# Patient Record
Sex: Female | Born: 1950 | Race: Black or African American | Hispanic: No | Marital: Single | Smoking: Current some day smoker
Health system: Southern US, Community
[De-identification: ages and names within clinical notes are randomized; demographics above are authoritative.]

## PROBLEM LIST (undated history)

## (undated) DIAGNOSIS — R188 Other ascites: Secondary | ICD-10-CM

## (undated) DIAGNOSIS — K7460 Unspecified cirrhosis of liver: Secondary | ICD-10-CM

## (undated) DIAGNOSIS — K766 Portal hypertension: Secondary | ICD-10-CM

## (undated) DIAGNOSIS — E785 Hyperlipidemia, unspecified: Secondary | ICD-10-CM

## (undated) DIAGNOSIS — E119 Type 2 diabetes mellitus without complications: Secondary | ICD-10-CM

## (undated) DIAGNOSIS — I8500 Esophageal varices without bleeding: Secondary | ICD-10-CM

## (undated) DIAGNOSIS — K7581 Nonalcoholic steatohepatitis (NASH): Secondary | ICD-10-CM

## (undated) DIAGNOSIS — I10 Essential (primary) hypertension: Secondary | ICD-10-CM

## (undated) DIAGNOSIS — K922 Gastrointestinal hemorrhage, unspecified: Secondary | ICD-10-CM

## (undated) DIAGNOSIS — I214 Non-ST elevation (NSTEMI) myocardial infarction: Secondary | ICD-10-CM

## (undated) DIAGNOSIS — I509 Heart failure, unspecified: Secondary | ICD-10-CM

## (undated) DIAGNOSIS — K219 Gastro-esophageal reflux disease without esophagitis: Secondary | ICD-10-CM

## (undated) HISTORY — DX: Unspecified cirrhosis of liver: K74.60

## (undated) HISTORY — DX: Hyperlipidemia, unspecified: E78.5

## (undated) HISTORY — DX: Gastro-esophageal reflux disease without esophagitis: K21.9

## (undated) HISTORY — DX: Non-ST elevation (NSTEMI) myocardial infarction: I21.4

## (undated) HISTORY — DX: Portal hypertension: K76.6

## (undated) HISTORY — PX: OTHER SURGICAL HISTORY: SHX169

## (undated) HISTORY — DX: Heart failure, unspecified: I50.9

---

## 2014-12-29 ENCOUNTER — Encounter: Payer: Self-pay | Admitting: Gastroenterology

## 2015-01-09 ENCOUNTER — Ambulatory Visit: Payer: Self-pay | Admitting: Gastroenterology

## 2015-01-23 ENCOUNTER — Ambulatory Visit: Payer: Self-pay | Admitting: Gastroenterology

## 2015-01-26 ENCOUNTER — Encounter: Payer: Self-pay | Admitting: Gastroenterology

## 2015-01-26 ENCOUNTER — Ambulatory Visit (INDEPENDENT_AMBULATORY_CARE_PROVIDER_SITE_OTHER): Payer: Self-pay | Admitting: Gastroenterology

## 2015-01-26 VITALS — BP 113/68 | HR 93 | Temp 97.1°F | Ht 63.0 in | Wt 196.2 lb

## 2015-01-26 DIAGNOSIS — R1030 Lower abdominal pain, unspecified: Secondary | ICD-10-CM | POA: Insufficient documentation

## 2015-01-26 DIAGNOSIS — K219 Gastro-esophageal reflux disease without esophagitis: Secondary | ICD-10-CM

## 2015-01-26 DIAGNOSIS — K7460 Unspecified cirrhosis of liver: Secondary | ICD-10-CM | POA: Insufficient documentation

## 2015-01-26 NOTE — Progress Notes (Signed)
CC'ED TO PCP 

## 2015-01-26 NOTE — Patient Instructions (Signed)
1. I will review your CT from [facility] and get in touch with you with further instructions.

## 2015-01-26 NOTE — Progress Notes (Signed)
Primary Care Physician:  [name], MD  Primary Gastroenterologist:  Barney Drain, MD   Chief Complaint  Patient presents with  . Cirrhosis  . abnormal CT    HPI:  Cheyenne Hicks is a 64 y.o. female here at the request of her PCP for further evaluation of ?cirrhosis seen on CT done during hospitalization at [facility] recently. Patient presented on October 16 with shortness of breath. She reports she had pneumonia. According to the medical record she also has CHF, STEMI. Patient reports undergoing a cardiac catheterization, possibly had a reaction, ended up in the ICU. We have requested records for review.  Prior to this hospitalization she was only taken aspirin and over-the-counter acid reflux medications. She has a history of GERD. She was switched to Protonix and is doing very well with regards to her acid reflux disease. Heartburn is well-controlled. Denies any history of dysphagia. Her breathing has improved. She feels a lot better now. She states she was given MiraLAX for constipation. She has been having some postprandial early satiety, lower abdominal discomfort with meals. She felt like it may have been the MiraLAX so she stopped about a week ago but symptoms persist. She describes lower abdominal pain is nagging pain. No associated nausea or vomiting. States her bowel function is normal now. Denies dysuria. No weight loss. Weight is up about 4 pounds.  Patient tells me she has a history of consuming 1 pint of liquor over 24-hour period of time about once per week. Denies daily consumption in past or present. During office visit she did ask if it be okay for her to consume alcohol over the holidays. I advised against it especially without having records to review. Is unclear the etiology of her CHF or if she has cirrhosis. I did discuss with her that one pint of liquor is way too much over 24-hour period time anyway.    Current Outpatient Prescriptions  Medication Sig  Dispense Refill  . aspirin 81 MG tablet Take 81 mg by mouth daily.    . carvedilol (COREG) 6.25 MG tablet Take 6.25 mg by mouth 2 (two) times daily with a meal.    . furosemide (LASIX) 40 MG tablet Take 40 mg by mouth.    Marland Kitchen lisinopril (PRINIVIL,ZESTRIL) 20 MG tablet Take 20 mg by mouth 2 (two) times daily.    . pantoprazole (PROTONIX) 40 MG tablet Take 40 mg by mouth daily.    . potassium chloride (KLOR-CON) 20 MEQ packet Take by mouth once.    . simvastatin (ZOCOR) 40 MG tablet Take 40 mg by mouth daily at 6 PM.     No current facility-administered medications for this visit.    Allergies as of 01/26/2015  . (No Known Allergies)    Past Medical History  Diagnosis Date  . CHF (congestive heart failure) ([place])   . Hyperlipidemia   . Cirrhosis ([place])   . Portal hypertension ([place])   . NSTEMI (non-ST elevated myocardial infarction) ([place])     11/2014  . GERD (gastroesophageal reflux disease)     Past Surgical History  Procedure Laterality Date  . None      Family History  Problem Relation Age of Onset  . Colon cancer Neg Hx   . Heart attack Mother   . Liver disease Other     patient not aware of any  . Heart attack Sister     died age 60    Social History   Social History  .  Marital Status: Unknown    Spouse Name: N/A  . Number of Children: 1  . Years of Education: N/A   Occupational History  . 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 Smoking status: Current Some Day Smoker -- 0.25 packs/day    Types: Cigarettes  . Smokeless tobacco: Not on file  . Alcohol Use: Not on file     Comment: pint in 24 hours once per week  . Drug Use: No  . Sexual Activity: Not on file   Other Topics Concern  . Not on file   Social History Narrative  . No narrative on file      ROS:  General: Negative for anorexia, weight loss, fever, chills, fatigue, weakness. Eyes: Negative for vision changes.  ENT: Negative for hoarseness, difficulty swallowing , nasal  congestion. CV: Negative for chest pain, angina, palpitations, dyspnea on exertion, peripheral edema. See history of present illness Respiratory: Negative for dyspnea at rest, dyspnea on exertion, cough, sputum, wheezing.  GI: See history of present illness. GU:  Negative for dysuria, hematuria, urinary incontinence, urinary frequency, nocturnal urination.  MS: Negative for joint pain, low back pain.  Derm: Negative for rash or itching.  Neuro: Negative for weakness, abnormal sensation, seizure, frequent headaches, memory loss, confusion.  Psych: Negative for anxiety, depression, suicidal ideation, hallucinations.  Endo: Negative for unusual weight change.  Heme: Negative for bruising or bleeding. Allergy: Negative for rash or hives.    Physical Examination:  BP 113/68 mmHg  Pulse 93  Temp(Src) 97.1 F (36.2 C)  Ht 5' 3"  (1.6 m)  Wt 196 lb 3.2 oz (88.996 kg)  BMI 34.76 kg/m2   General: Well-nourished, well-developed in no acute distress.  Head: Normocephalic, atraumatic.   Eyes: Conjunctiva pink, no icterus. Mouth: Oropharyngeal mucosa moist and pink , no lesions erythema or exudate. Neck: Supple without thyromegaly, masses, or lymphadenopathy.  Lungs: Clear to auscultation bilaterally.  Heart: Regular rate and rhythm, no murmurs rubs or gallops.  Abdomen: Bowel sounds are normal, nontender, nondistended, no hepatosplenomegaly or masses, no abdominal bruits or    hernia , no rebound or guarding.   Rectal: Not performed Extremities: No lower extremity edema. No clubbing or deformities.  Neuro: Alert and oriented x 4 , grossly normal neurologically.  Skin: Warm and dry, no rash or jaundice.   Psych: Alert and cooperative, normal mood and affect.  Labs: Reviewed outside labs. Dated 12/12/2014 White blood cell count 6.37, hemoglobin 12.8, hematocrit 40.1, MCV 87, platelets 105,000 low, potassium 3.4, calcium 9, total bilirubin 1.1, albumin 3.1 low, AST 20, ALT 15, alkaline  phosphatase 89, hemoglobin A1c 7.7, hepatitis A IgM nonreactive, hepatitis B core IgM antibody nonreactive, hepatitis B surface antigen nonreactive, hepatitis C antibody nonreactive, INR 1.0 low, PT 11.1  Imaging Studies: No results found.

## 2015-01-26 NOTE — Assessment & Plan Note (Signed)
64 year old female who presents for further evaluation based on abnormality seen on CT scan during recent hospitalization. According to the patient she was told that she had cirrhosis. We have requested copy of CT scan and other records. Viral markers negative. Hepatitis A and B immune status unknown. She has a history of significant alcohol consumption when she does drink (reports once per week) although she appears to be in denial. We will retrieve records. If she does have any evidence of advanced liver disease, we will check for hepatitis A and B immune status, she will likely need an upper endoscopy to screen for esophageal varices, consider other serologies to rule out other underlying liver disease. She also needs to have screening colonoscopy in the near future and can be done at time of her upper endoscopy.  Await CT report prior to making recommendations regarding lower abdominal pain. Unremarkable exam today. Etiology unclear.

## 2015-02-26 ENCOUNTER — Ambulatory Visit (INDEPENDENT_AMBULATORY_CARE_PROVIDER_SITE_OTHER): Payer: Self-pay | Admitting: Gastroenterology

## 2015-02-26 ENCOUNTER — Encounter: Payer: Self-pay | Admitting: Gastroenterology

## 2015-02-26 VITALS — BP 115/70 | HR 99 | Temp 97.3°F | Ht 63.0 in | Wt 196.4 lb

## 2015-02-26 DIAGNOSIS — K7460 Unspecified cirrhosis of liver: Secondary | ICD-10-CM

## 2015-02-26 DIAGNOSIS — K219 Gastro-esophageal reflux disease without esophagitis: Secondary | ICD-10-CM

## 2015-02-26 NOTE — Progress Notes (Signed)
Primary Care Physician: [name], MD  Primary Gastroenterologist:  Barney Drain, MD   Chief Complaint  Patient presents with  . Follow-up    HPI: Cheyenne Hicks is a 65 y.o. female here for follow up. She was last seen 01/26/15 as new patient referral for possible cirrhosis. We were able to obtain records from [facility]. Admitted with non-ST segment elevation MI secondary to demand ischemia in the setting of suspected community-acquired versus aspiration pneumonia. CHF with reduced ejection fraction of 30-35%. Developed acute respiratory failure with flash pulmonary edema. During evaluation she underwent CT of the chest on 12/07/2014 with contrast showing limited images through the upper abdomen demonstrating micronodular contour to the liver with increased caudate lobe suggesting cirrhosis. Mild to moderate splenomegaly, mildly enlarged gastrohepatic lymph nodes with largest measuring 2 cm, enlarged. Pancreatic lymph nodes measuring 2.5 cm. Viral markers including hepatitis A IgM, hepatitis B core IgM, hepatitis B surface antigen, hepatitis C antibody all nonreactive.  Continues cardiology follow-up with Dr. Gennette Pac at a satellite clinic with [facility]. Had follow-up study recently but waiting on results. She cannot tell me exactly what she had done. Question EKG versus echo.  Clinically doing well from a GI standpoint. Denies heartburn. No abdominal pain. Bowel movements are regular. No melena rectal bleeding. She has a history of consuming 1 pint of liquor over 24-hour period time about once per weekly. Over the holiday she tried to take a drink but states she couldn't do it. Didn't feel good. Only attempt for alcohol consumption since 12/06/2014. Denies nausea or vomiting. No prior EGD or colonoscopy.ed to drink on holidays. Cut back on cigarette use. One pack last her for 4 days now.    Current Outpatient Prescriptions  Medication Sig Dispense Refill  . aspirin 81 MG  tablet Take 81 mg by mouth daily.    . carvedilol (COREG) 6.25 MG tablet Take 6.25 mg by mouth 2 (two) times daily with a meal.    . furosemide (LASIX) 40 MG tablet Take 40 mg by mouth 2 (two) times daily.     Marland Kitchen lisinopril (PRINIVIL,ZESTRIL) 20 MG tablet Take 20 mg by mouth 2 (two) times daily.    . metFORMIN (GLUCOPHAGE) 850 MG tablet Take 850 mg by mouth 2 (two) times daily with a meal.    . pantoprazole (PROTONIX) 40 MG tablet Take 40 mg by mouth daily.    . potassium chloride (KLOR-CON) 20 MEQ packet Take by mouth once.    . simvastatin (ZOCOR) 40 MG tablet Take 40 mg by mouth daily at 6 PM.     No current facility-administered medications for this visit.    Allergies as of 02/26/2015  . (No Known Allergies)    ROS:  General: Negative for anorexia, weight loss, fever, chills, fatigue, weakness. ENT: Negative for hoarseness, difficulty swallowing , nasal congestion. CV: Negative for chest pain, angina, palpitations, dyspnea on exertion, peripheral edema.  Respiratory: Negative for dyspnea at rest, dyspnea on exertion, cough, sputum, wheezing.  GI: See history of present illness. GU:  Negative for dysuria, hematuria, urinary incontinence, urinary frequency, nocturnal urination.  Endo: Negative for unusual weight change.    Physical Examination:   BP 115/70 mmHg  Pulse 99  Temp(Src) 97.3 F (36.3 C)  Ht 5' 3"  (1.6 m)  Wt 196 lb 6.4 oz (89.086 kg)  BMI 34.80 kg/m2  General: Well-nourished, well-developed in no acute distress.  Eyes: No icterus. Mouth: Oropharyngeal mucosa moist and pink , no lesions  erythema or exudate. Lungs: Clear to auscultation bilaterally.  Heart: Regular rate and rhythm, no murmurs rubs or gallops.  Abdomen: Bowel sounds are normal, nontender, nondistended, no hepatosplenomegaly or masses, no abdominal bruits or hernia , no rebound or guarding.   Extremities: No lower extremity edema. No clubbing or deformities. Neuro: Alert and oriented x 4   Skin:  Warm and dry, no jaundice.   Psych: Alert and cooperative, normal mood and affect.

## 2015-02-26 NOTE — Patient Instructions (Addendum)
1. Please have your labs and ultrasound of your liver done in 03/2015. 2. Return to the office in 04/2015. At that time we will plan on upper endoscopy and colonoscopy.  3. Great job on cutting back on smoking and alcohol! Keep up good work!

## 2015-03-02 NOTE — Assessment & Plan Note (Addendum)
CT chest with limited views, findings consistent with cirrhosis and splenomegaly. Viral markers negative. She has had no alcohol since October 2016. At this point we need to check for hepatitis A and B immunity. Update labs. Would recommend upper endoscopy along with screening colonoscopy in the near future but given fresh MI we will postpone for a few more months. I have asked her to discuss with her cardiologist at her follow-up appointment with regards to their opinion when she would be stable for endoscopy.  We will also get abdominal ultrasound to fully evaluate her liver.  Return to the office in 3 months, at that time consider EGD and colonoscopy.

## 2015-03-02 NOTE — Assessment & Plan Note (Signed)
Continue PPI therapy. 

## 2015-03-03 NOTE — Progress Notes (Signed)
cc'ed to pcp °

## 2015-03-25 ENCOUNTER — Other Ambulatory Visit: Payer: Self-pay

## 2015-03-25 DIAGNOSIS — K7030 Alcoholic cirrhosis of liver without ascites: Secondary | ICD-10-CM

## 2015-04-02 ENCOUNTER — Ambulatory Visit (HOSPITAL_COMMUNITY): Admission: RE | Admit: 2015-04-02 | Payer: BLUE CROSS/BLUE SHIELD | Source: Ambulatory Visit

## 2015-04-28 ENCOUNTER — Ambulatory Visit: Payer: Self-pay | Admitting: Gastroenterology

## 2015-05-23 HISTORY — PX: CORONARY ANGIOPLASTY WITH STENT PLACEMENT: SHX49

## 2015-05-23 HISTORY — PX: VALVE REPLACEMENT: SUR13

## 2016-02-01 ENCOUNTER — Encounter: Payer: Self-pay | Admitting: Gastroenterology

## 2016-02-10 ENCOUNTER — Encounter: Payer: Self-pay | Admitting: Gastroenterology

## 2016-02-10 ENCOUNTER — Telehealth: Payer: Self-pay | Admitting: Gastroenterology

## 2016-02-10 ENCOUNTER — Ambulatory Visit: Payer: BLUE CROSS/BLUE SHIELD | Admitting: Gastroenterology

## 2016-02-10 NOTE — Telephone Encounter (Signed)
PT WAS A NO SHOW AND LETTER SENT

## 2017-08-28 ENCOUNTER — Other Ambulatory Visit: Payer: Self-pay

## 2017-08-28 ENCOUNTER — Encounter (HOSPITAL_COMMUNITY): Payer: Self-pay | Admitting: Emergency Medicine

## 2017-08-28 ENCOUNTER — Inpatient Hospital Stay (HOSPITAL_COMMUNITY)
Admission: EM | Admit: 2017-08-28 | Discharge: 2017-09-01 | DRG: 432 | Disposition: A | Discharge status: Home / Self Care | Payer: Medicare HMO | Attending: Internal Medicine | Admitting: Internal Medicine

## 2017-08-28 DIAGNOSIS — K254 Chronic or unspecified gastric ulcer with hemorrhage: Secondary | ICD-10-CM | POA: Diagnosis present

## 2017-08-28 DIAGNOSIS — Z952 Presence of prosthetic heart valve: Secondary | ICD-10-CM | POA: Diagnosis not present

## 2017-08-28 DIAGNOSIS — I509 Heart failure, unspecified: Secondary | ICD-10-CM | POA: Diagnosis present

## 2017-08-28 DIAGNOSIS — Z7902 Long term (current) use of antithrombotics/antiplatelets: Secondary | ICD-10-CM | POA: Diagnosis not present

## 2017-08-28 DIAGNOSIS — K766 Portal hypertension: Secondary | ICD-10-CM | POA: Diagnosis present

## 2017-08-28 DIAGNOSIS — K7460 Unspecified cirrhosis of liver: Principal | ICD-10-CM | POA: Diagnosis present

## 2017-08-28 DIAGNOSIS — K7030 Alcoholic cirrhosis of liver without ascites: Secondary | ICD-10-CM

## 2017-08-28 DIAGNOSIS — K222 Esophageal obstruction: Secondary | ICD-10-CM | POA: Diagnosis present

## 2017-08-28 DIAGNOSIS — D62 Acute posthemorrhagic anemia: Secondary | ICD-10-CM

## 2017-08-28 DIAGNOSIS — K921 Melena: Secondary | ICD-10-CM | POA: Diagnosis present

## 2017-08-28 DIAGNOSIS — I110 Hypertensive heart disease with heart failure: Secondary | ICD-10-CM | POA: Diagnosis present

## 2017-08-28 DIAGNOSIS — R195 Other fecal abnormalities: Secondary | ICD-10-CM

## 2017-08-28 DIAGNOSIS — F17210 Nicotine dependence, cigarettes, uncomplicated: Secondary | ICD-10-CM | POA: Diagnosis present

## 2017-08-28 DIAGNOSIS — K862 Cyst of pancreas: Secondary | ICD-10-CM | POA: Diagnosis present

## 2017-08-28 DIAGNOSIS — E119 Type 2 diabetes mellitus without complications: Secondary | ICD-10-CM | POA: Diagnosis present

## 2017-08-28 DIAGNOSIS — Q2739 Arteriovenous malformation, other site: Secondary | ICD-10-CM

## 2017-08-28 DIAGNOSIS — K449 Diaphragmatic hernia without obstruction or gangrene: Secondary | ICD-10-CM | POA: Diagnosis present

## 2017-08-28 DIAGNOSIS — Z79899 Other long term (current) drug therapy: Secondary | ICD-10-CM

## 2017-08-28 DIAGNOSIS — K922 Gastrointestinal hemorrhage, unspecified: Secondary | ICD-10-CM | POA: Diagnosis present

## 2017-08-28 DIAGNOSIS — K31811 Angiodysplasia of stomach and duodenum with bleeding: Secondary | ICD-10-CM | POA: Diagnosis present

## 2017-08-28 DIAGNOSIS — E785 Hyperlipidemia, unspecified: Secondary | ICD-10-CM | POA: Diagnosis present

## 2017-08-28 DIAGNOSIS — Z794 Long term (current) use of insulin: Secondary | ICD-10-CM | POA: Diagnosis not present

## 2017-08-28 DIAGNOSIS — I8501 Esophageal varices with bleeding: Principal | ICD-10-CM

## 2017-08-28 DIAGNOSIS — Z888 Allergy status to other drugs, medicaments and biological substances status: Secondary | ICD-10-CM

## 2017-08-28 DIAGNOSIS — Z8659 Personal history of other mental and behavioral disorders: Secondary | ICD-10-CM

## 2017-08-28 DIAGNOSIS — I8511 Secondary esophageal varices with bleeding: Secondary | ICD-10-CM | POA: Diagnosis present

## 2017-08-28 DIAGNOSIS — D649 Anemia, unspecified: Secondary | ICD-10-CM | POA: Diagnosis not present

## 2017-08-28 DIAGNOSIS — D696 Thrombocytopenia, unspecified: Secondary | ICD-10-CM | POA: Diagnosis present

## 2017-08-28 DIAGNOSIS — K760 Fatty (change of) liver, not elsewhere classified: Secondary | ICD-10-CM | POA: Diagnosis present

## 2017-08-28 DIAGNOSIS — Z955 Presence of coronary angioplasty implant and graft: Secondary | ICD-10-CM | POA: Diagnosis not present

## 2017-08-28 DIAGNOSIS — Z7982 Long term (current) use of aspirin: Secondary | ICD-10-CM

## 2017-08-28 DIAGNOSIS — K219 Gastro-esophageal reflux disease without esophagitis: Secondary | ICD-10-CM | POA: Diagnosis not present

## 2017-08-28 DIAGNOSIS — I2510 Atherosclerotic heart disease of native coronary artery without angina pectoris: Secondary | ICD-10-CM

## 2017-08-28 DIAGNOSIS — Z91041 Radiographic dye allergy status: Secondary | ICD-10-CM

## 2017-08-28 DIAGNOSIS — I252 Old myocardial infarction: Secondary | ICD-10-CM | POA: Diagnosis not present

## 2017-08-28 DIAGNOSIS — E118 Type 2 diabetes mellitus with unspecified complications: Secondary | ICD-10-CM | POA: Diagnosis not present

## 2017-08-28 HISTORY — DX: Type 2 diabetes mellitus without complications: E11.9

## 2017-08-28 HISTORY — DX: Essential (primary) hypertension: I10

## 2017-08-28 LAB — CBC WITH DIFFERENTIAL/PLATELET
BASOS ABS: 0 10*3/uL (ref 0.0–0.1)
Basophils Relative: 1 %
EOS ABS: 0.1 10*3/uL (ref 0.0–0.7)
EOS PCT: 2 %
HCT: 19.1 % — ABNORMAL LOW (ref 36.0–46.0)
Hemoglobin: 5.4 g/dL — CL (ref 12.0–15.0)
LYMPHS PCT: 19 %
Lymphs Abs: 1 10*3/uL (ref 0.7–4.0)
MCH: 22 pg — ABNORMAL LOW (ref 26.0–34.0)
MCHC: 28.3 g/dL — ABNORMAL LOW (ref 30.0–36.0)
MCV: 77.6 fL — AB (ref 78.0–100.0)
Monocytes Absolute: 0.6 10*3/uL (ref 0.1–1.0)
Monocytes Relative: 11 %
Neutro Abs: 3.6 10*3/uL (ref 1.7–7.7)
Neutrophils Relative %: 67 %
PLATELETS: 87 10*3/uL — AB (ref 150–400)
RBC: 2.46 MIL/uL — AB (ref 3.87–5.11)
RDW: 20 % — ABNORMAL HIGH (ref 11.5–15.5)
WBC: 5.3 10*3/uL (ref 4.0–10.5)

## 2017-08-28 LAB — BASIC METABOLIC PANEL
Anion gap: 7 (ref 5–15)
BUN: 20 mg/dL (ref 8–23)
CALCIUM: 8.7 mg/dL — AB (ref 8.9–10.3)
CO2: 25 mmol/L (ref 22–32)
Chloride: 103 mmol/L (ref 98–111)
Creatinine, Ser: 1.15 mg/dL — ABNORMAL HIGH (ref 0.44–1.00)
GFR calc Af Amer: 57 mL/min — ABNORMAL LOW (ref >60)
GFR, EST NON AFRICAN AMERICAN: 49 mL/min — AB (ref >60)
GLUCOSE: 158 mg/dL — AB (ref 70–99)
Potassium: 3.8 mmol/L (ref 3.5–5.1)
Sodium: 135 mmol/L (ref 135–145)

## 2017-08-28 LAB — ABO/RH: ABO/RH(D): A POS

## 2017-08-28 LAB — GLUCOSE, CAPILLARY: Glucose-Capillary: 242 mg/dL — ABNORMAL HIGH (ref 70–99)

## 2017-08-28 LAB — POC OCCULT BLOOD, ED: Fecal Occult Bld: POSITIVE — AB

## 2017-08-28 LAB — PREPARE RBC (CROSSMATCH)

## 2017-08-28 MED ORDER — CARVEDILOL 3.125 MG PO TABS
6.2500 mg | ORAL_TABLET | Freq: Two times a day (BID) | ORAL | Status: DC
  Administered 2017-08-28 – 2017-08-29 (×2): 6.25 mg via ORAL
  Filled 2017-08-28 (×2): qty 2

## 2017-08-28 MED ORDER — ONDANSETRON HCL 4 MG/2ML IJ SOLN: 4.0000 mg | Freq: Four times a day (QID) | INTRAMUSCULAR | Status: DC | PRN

## 2017-08-28 MED ORDER — LACTATED RINGERS IV SOLN
INTRAVENOUS | Status: AC
  Administered 2017-08-28: 19:00:00 via INTRAVENOUS

## 2017-08-28 MED ORDER — SODIUM CHLORIDE 0.9 % IV SOLN
80.0000 mg | Freq: Once | INTRAVENOUS | Status: DC
  Filled 2017-08-28: qty 80

## 2017-08-28 MED ORDER — SODIUM CHLORIDE 0.9% FLUSH
3.0000 mL | INTRAVENOUS | Status: DC | PRN
  Administered 2017-08-28: 3 mL via INTRAVENOUS
  Filled 2017-08-28: qty 3

## 2017-08-28 MED ORDER — INSULIN ASPART 100 UNIT/ML ~~LOC~~ SOLN
0.0000 [IU] | Freq: Every day | SUBCUTANEOUS | Status: DC
  Administered 2017-08-28: 2 [IU] via SUBCUTANEOUS
  Administered 2017-08-30: 0 [IU] via SUBCUTANEOUS

## 2017-08-28 MED ORDER — PANTOPRAZOLE SODIUM 40 MG IV SOLR
40.0000 mg | Freq: Two times a day (BID) | INTRAVENOUS | Status: DC
  Filled 2017-08-28: qty 40

## 2017-08-28 MED ORDER — SODIUM CHLORIDE 0.9 % IV SOLN: 250.0000 mL | INTRAVENOUS | Status: DC | PRN

## 2017-08-28 MED ORDER — PANTOPRAZOLE SODIUM 40 MG IV SOLR
INTRAVENOUS | Status: AC
  Filled 2017-08-28: qty 160

## 2017-08-28 MED ORDER — INSULIN ASPART 100 UNIT/ML ~~LOC~~ SOLN
0.0000 [IU] | Freq: Three times a day (TID) | SUBCUTANEOUS | Status: DC
  Administered 2017-08-29: 2 [IU] via SUBCUTANEOUS
  Administered 2017-08-29: 5 [IU] via SUBCUTANEOUS
  Administered 2017-08-29 – 2017-08-30 (×2): 2 [IU] via SUBCUTANEOUS
  Administered 2017-08-31: 3 [IU] via SUBCUTANEOUS
  Administered 2017-09-01: 5 [IU] via SUBCUTANEOUS
  Administered 2017-09-01: 3 [IU] via SUBCUTANEOUS

## 2017-08-28 MED ORDER — SODIUM CHLORIDE 0.9 % IV SOLN
8.0000 mg/h | INTRAVENOUS | Status: DC
  Administered 2017-08-28 – 2017-08-31 (×4): 8 mg/h via INTRAVENOUS
  Filled 2017-08-28 (×8): qty 80

## 2017-08-28 MED ORDER — SODIUM CHLORIDE 0.9% FLUSH
3.0000 mL | Freq: Two times a day (BID) | INTRAVENOUS | Status: DC
  Administered 2017-08-29 – 2017-08-30 (×2): 3 mL via INTRAVENOUS
  Administered 2017-08-31: 23:00:00 via INTRAVENOUS
  Administered 2017-08-31 – 2017-09-01 (×2): 3 mL via INTRAVENOUS

## 2017-08-28 MED ORDER — SODIUM CHLORIDE 0.9 % IV SOLN: 10.0000 mL/h | Freq: Once | INTRAVENOUS | Status: DC

## 2017-08-28 MED ORDER — ONDANSETRON HCL 4 MG PO TABS: 4.0000 mg | ORAL_TABLET | Freq: Four times a day (QID) | ORAL | Status: DC | PRN

## 2017-08-28 MED ORDER — INSULIN DETEMIR 100 UNIT/ML ~~LOC~~ SOLN
18.0000 [IU] | Freq: Every evening | SUBCUTANEOUS | Status: DC
  Administered 2017-08-28 – 2017-08-31 (×4): 18 [IU] via SUBCUTANEOUS
  Filled 2017-08-28 (×7): qty 0.18

## 2017-08-28 NOTE — H&P (Signed)
History and Physical    Yisel Megill NLG:[id number] DOB: [birth year] DOA: 08/28/2017  PCP: [name], MD   Patient coming from: Home  Chief Complaint: Weakness  HPI: Cheyenne Hicks is a 67 y.o. female with medical history significant for CAD with prior stent on Plavix and aspirin, cirrhosis with portal hypertension, GERD, and dyslipidemia, who woke up this morning feeling quite weak with some transient blurry vision as well as confusion, dizziness, and lightheadedness.  She went to the urgent care facility and was told that her hemoglobin levels were very low and was told to come to the ED for further evaluation.  She states that approximately 1 week ago she had some very dark emesis with some blood as well as dark, tarry stools.  She states that this began shortly after taking iron supplementation that was prescribed by her PCP with a diagnosis of iron deficiency anemia.  She has since stopped this medication with some improvement in her symptoms, but has maintained some nausea with poor appetite and has not had a bowel movement in the last 2 to 3 days. She denies any abdominal pain, fever, chills, chest pain, or shortness of breath.   ED Course: Vital signs are stable and hemoglobin noted to be 5.4.  Platelet count of 87,000.  Hemoccult positive.  Review of Systems: All others reviewed and otherwise negative.  Past Medical History:  Diagnosis Date  . CHF (congestive heart failure) ([place])   . Cirrhosis ([place])   . GERD (gastroesophageal reflux disease)   . Hyperlipidemia   . NSTEMI (non-ST elevated myocardial infarction) ([place])    11/2014  . Portal hypertension ([facility])     Past Surgical History:  Procedure Laterality Date  . CORONARY ANGIOPLASTY WITH STENT PLACEMENT    . none    . VALVE REPLACEMENT       reports that she has been smoking cigarettes.  She has been smoking about 0.25 packs per day. She has never used smokeless tobacco. She reports that she drank alcohol. She  reports that she does not use drugs.  Allergies  Allergen Reactions  . Iodinated Diagnostic Agents Hives    Reports severe hives and syncope after heart cath  . Metoprolol Itching    Family History  Problem Relation Age of Onset  . Heart attack Mother   . Liver disease Other        patient not aware of any  . Heart attack Sister        died age 59  . Colon cancer Neg Hx     Prior to Admission medications   Medication Sig Start Date End Date Taking? Authorizing Provider  aspirin 81 MG tablet Take 81 mg by mouth daily.   Yes [provider]  carvedilol (COREG) 6.25 MG tablet Take 6.25 mg by mouth 2 (two) times daily with a meal.   Yes [provider]  clopidogrel (PLAVIX) 75 MG tablet Take 75 mg by mouth every morning.  09/06/15  Yes [provider]  ferrous gluconate (FERGON) 324 MG tablet Take 324 mg by mouth every evening.  08/18/17  Yes [provider]  furosemide (LASIX) 40 MG tablet Take 40 mg by mouth every morning.    Yes [provider]  insulin detemir (LEVEMIR) 100 UNIT/ML injection Inject 36 Units into the skin every evening.   Yes [provider]  lisinopril (PRINIVIL,ZESTRIL) 10 MG tablet Take 10 mg by mouth every morning.    Yes [provider]  metFORMIN (  GLUCOPHAGE) 500 MG tablet Take 500 mg by mouth 2 (two) times daily with a meal.    Yes [provider]  ranitidine (ZANTAC) 150 MG tablet Take 150 mg by mouth at bedtime.   Yes [provider]  simvastatin (ZOCOR) 40 MG tablet Take 40 mg by mouth daily at 6 PM.   Yes [provider]  vitamin C (ASCORBIC ACID) 500 MG tablet Take 500 mg by mouth every evening.   Yes [provider]    Physical Exam: Vitals:   08/28/17 1423 08/28/17 1425  BP: (!) 130/55   Pulse: (!) 115   Resp: 15   Temp: 98.2 F (36.8 C)   TempSrc: Oral   SpO2: 100%   Weight:  87.1 kg (192 lb)  Height:  5' 3"  (1.6 m)    Constitutional: NAD,  calm, comfortable Vitals:   08/28/17 1423 08/28/17 1425  BP: (!) 130/55   Pulse: (!) 115   Resp: 15   Temp: 98.2 F (36.8 C)   TempSrc: Oral   SpO2: 100%   Weight:  87.1 kg (192 lb)  Height:  5' 3"  (1.6 m)   Eyes: lids and conjunctivae normal ENMT: Mucous membranes are moist.  Neck: normal, supple Respiratory: clear to auscultation bilaterally. Normal respiratory effort. No accessory muscle use.  Cardiovascular: Regular rate and rhythm, no murmurs. No extremity edema. Abdomen: no tenderness, no distention. Bowel sounds positive.  Musculoskeletal:  No joint deformity upper and lower extremities.   Skin: no rashes, lesions, ulcers.  Psychiatric: Normal judgment and insight. Alert and oriented x 3. Normal mood.   Labs on Admission: I have personally reviewed following labs and imaging studies  CBC: Recent Labs  Lab 08/28/17 1454  WBC 5.3  NEUTROABS 3.6  HGB 5.4*  HCT 19.1*  MCV 77.6*  PLT 87*   Basic Metabolic Panel: Recent Labs  Lab 08/28/17 1454  NA 135  K 3.8  CL 103  CO2 25  GLUCOSE 158*  BUN 20  CREATININE 1.15*  CALCIUM 8.7*   GFR: Estimated Creatinine Clearance: 51 mL/min (A) (by C-G formula based on SCr of 1.15 mg/dL (H)). Liver Function Tests: No results for input(s): AST, ALT, ALKPHOS, BILITOT, PROT, ALBUMIN in the last 168 hours. No results for input(s): LIPASE, AMYLASE in the last 168 hours. No results for input(s): AMMONIA in the last 168 hours. Coagulation Profile: No results for input(s): INR, PROTIME in the last 168 hours. Cardiac Enzymes: No results for input(s): CKTOTAL, CKMB, CKMBINDEX, TROPONINI in the last 168 hours. BNP (last 3 results) No results for input(s): PROBNP in the last 8760 hours. HbA1C: No results for input(s): HGBA1C in the last 72 hours. CBG: No results for input(s): GLUCAP in the last 168 hours. Lipid Profile: No results for input(s): CHOL, HDL, LDLCALC, TRIG, CHOLHDL, LDLDIRECT in the last 72 hours. Thyroid Function  Tests: No results for input(s): TSH, T4TOTAL, FREET4, T3FREE, THYROIDAB in the last 72 hours. Anemia Panel: No results for input(s): VITAMINB12, FOLATE, FERRITIN, TIBC, IRON, RETICCTPCT in the last 72 hours. Urine analysis: No results found for: COLORURINE, APPEARANCEUR, LABSPEC, PHURINE, GLUCOSEU, HGBUR, BILIRUBINUR, KETONESUR, PROTEINUR, UROBILINOGEN, NITRITE, LEUKOCYTESUR  Radiological Exams on Admission: No results found.   Assessment/Plan Principal Problem:   Acute blood loss anemia Active Problems:   Cirrhosis ([facility])   GERD (gastroesophageal reflux disease)   CAD (coronary artery disease)   Portal hypertension ([place])   Diabetes ([place])    1. Acute symptomatic blood loss anemia.  Suspect secondary to  GI bleed with recent history of hematemesis and dark tarry stools along with finding of positive FOBT.  Patient noted to have prior history of iron deficiency anemia.  PRBC transfusion ordered in ED which is currently pending.  Patient is hemodynamically stable.  Place on Protonix drip in order anemia panel with H&H posttransfusion and repeat CBC in a.m.  GI consultation for endoscopy. 2. GERD.  Maintain on PPI drip. 3. CAD.  Hold antiplatelet agents and Plavix.  Maintain on SCDs. 4. Type 2 diabetes.  Withhold metformin and half the dose of Lantus with SSI.  No severe hyperglycemia noted at this time. 5. Cirrhosis with portal hypertension.  Noted. 6. Thrombocytopenia.  Likely secondary to above.   DVT prophylaxis: SCDs Code Status: Full  Family Communication: None Disposition Plan: PRBC transfusion and GI evaluation Consults called:GI Admission status: Inpatient, MedSurg   Brandell Maready Darleen Crocker DO [facility] Pager [phone]  If 7PM-7AM, please contact night-coverage [email or web address] Password [facility]  08/28/2017, 6:09 PM

## 2017-08-28 NOTE — ED Notes (Signed)
Pt states she stopped taking Iron supplement which is new per pt due to N/V she thought was causing it.

## 2017-08-28 NOTE — ED Triage Notes (Signed)
PT states she has no energy and has stopped taking her iron medications over the past 3 days. PT states she went to urgent care in [place] and they told her to come to ED for a blood transfusion.

## 2017-08-28 NOTE — ED Notes (Signed)
CRITICAL VALUE ALERT  Critical Value:  Hemoglobin 5.4  Date & Time Notied:  08/28/2017, 1548  Provider Notified: Dr. Vanita Panda  Orders Received/Actions taken: see chart

## 2017-08-28 NOTE — ED Provider Notes (Signed)
[facility] EMERGENCY DEPARTMENT Provider Note   CSN: [id number] Arrival date & time: 08/28/17  1404     History   Chief Complaint Chief Complaint  Patient presents with  . Abnormal Lab    HPI Cheyenne Hicks is a 67 y.o. female.  HPI Patient presents with concern of weakness. She went to urgent care after waking up with transient blurry vision, and ongoing weakness, without focality, without confusion, without syncope. L care she was told that she had anemia and was sent here for evaluation. She notes that she is taking Plavix for history of CAD, prior stent. She also was recently started on iron due to possible anemia, though she has never received a blood transfusion.  She is here with his son who assists with the HPI.   Past Medical History:  Diagnosis Date  . CHF (congestive heart failure) ([place])   . Cirrhosis ([place])   . GERD (gastroesophageal reflux disease)   . Hyperlipidemia   . NSTEMI (non-ST elevated myocardial infarction) ([place])    11/2014  . Portal hypertension [facility])     Patient Active Problem List   Diagnosis Date Noted  . Cirrhosis ([place]) 01/26/2015  . Lower abdominal pain 01/26/2015  . GERD (gastroesophageal reflux disease) 01/26/2015    Past Surgical History:  Procedure Laterality Date  . CORONARY ANGIOPLASTY WITH STENT PLACEMENT    . none    . VALVE REPLACEMENT       OB History    Gravida      Para      Term      Preterm      AB      Living  1     SAB      TAB      Ectopic      Multiple      Live Births               Home Medications    Prior to Admission medications   Medication Sig Start Date End Date Taking? Authorizing Provider  aspirin 81 MG tablet Take 81 mg by mouth daily.   Yes [provider]  carvedilol (COREG) 6.25 MG tablet Take 6.25 mg by mouth 2 (two) times daily with a meal.   Yes [provider]  clopidogrel (PLAVIX) 75 MG tablet Take 75 mg by mouth every morning.  09/06/15  Yes  [provider]  ferrous gluconate (FERGON) 324 MG tablet Take 324 mg by mouth every evening.  08/18/17  Yes [provider]  furosemide (LASIX) 40 MG tablet Take 40 mg by mouth every morning.    Yes [provider]  insulin detemir (LEVEMIR) 100 UNIT/ML injection Inject 36 Units into the skin every evening.   Yes [provider]  lisinopril (PRINIVIL,ZESTRIL) 10 MG tablet Take 10 mg by mouth every morning.    Yes [provider]  metFORMIN (GLUCOPHAGE) 500 MG tablet Take 500 mg by mouth 2 (two) times daily with a meal.    Yes [provider]  ranitidine (ZANTAC) 150 MG tablet Take 150 mg by mouth at bedtime.   Yes [provider]  simvastatin (ZOCOR) 40 MG tablet Take 40 mg by mouth daily at 6 PM.   Yes [provider]  vitamin C (ASCORBIC ACID) 500 MG tablet Take 500 mg by mouth every evening.   Yes [provider]    Family History Family History  Problem Relation Age of Onset  . Heart attack Mother   .  Liver disease Other        patient not aware of any  . Heart attack Sister        died age 74  . Colon cancer Neg Hx     Social History Social History   Tobacco Use  . Smoking status: Current Some Day Smoker    Packs/day: 0.25    Types: Cigarettes  . Smokeless tobacco: Never Used  Substance Use Topics  . Alcohol use: Not Currently    Alcohol/week: 0.0 oz    Comment: pint in 24 hours once per week  . Drug use: No     Allergies   Iodinated diagnostic agents and Metoprolol   Review of Systems Review of Systems  Constitutional:       Per HPI, otherwise negative  HENT:       Per HPI, otherwise negative  Eyes: Positive for visual disturbance.  Respiratory:       Per HPI, otherwise negative  Cardiovascular:       Per HPI, otherwise negative  Gastrointestinal: Positive for nausea. Negative for vomiting.  Endocrine:       Negative aside from HPI  Genitourinary:       Neg aside from HPI     Musculoskeletal:       Per HPI, otherwise negative  Skin: Negative.   Neurological: Positive for weakness. Negative for syncope.     Physical Exam Updated Vital Signs BP (!) 130/55 (BP Location: Left Arm)   Pulse (!) 115   Temp 98.2 F (36.8 C) (Oral)   Resp 15   Ht 5' 3"  (1.6 m)   Wt 87.1 kg (192 lb)   SpO2 100%   BMI 34.01 kg/m   Physical Exam  Constitutional: She is oriented to person, place, and time. She appears well-developed and well-nourished. No distress.  HENT:  Head: Normocephalic and atraumatic.  Eyes: Conjunctivae and EOM are normal.  Cardiovascular: Regular rhythm. Tachycardia present.  Pulmonary/Chest: Effort normal and breath sounds normal. No stridor. No respiratory distress.  Abdominal: She exhibits no distension.  Musculoskeletal: She exhibits no edema.  Neurological: She is alert and oriented to person, place, and time. No cranial nerve deficit.  Skin: Skin is warm and dry.  Psychiatric: She has a normal mood and affect.  Nursing note and vitals reviewed.    ED Treatments / Results  Labs (all labs ordered are listed, but only abnormal results are displayed) Labs Reviewed  CBC WITH DIFFERENTIAL/PLATELET - Abnormal; Notable for the following components:      Result Value   RBC 2.46 (*)    Hemoglobin 5.4 (*)    HCT 19.1 (*)    MCV 77.6 (*)    MCH 22.0 (*)    MCHC 28.3 (*)    RDW 20.0 (*)    Platelets 87 (*)    All other components within normal limits  BASIC METABOLIC PANEL - Abnormal; Notable for the following components:   Glucose, Bld 158 (*)    Creatinine, Ser 1.15 (*)    Calcium 8.7 (*)    GFR calc non Af Amer 49 (*)    GFR calc Af Amer 57 (*)    All other components within normal limits  POC OCCULT BLOOD, ED - Abnormal; Notable for the following components:   Fecal Occult Bld POSITIVE (*)    All other components within normal limits  OCCULT BLOOD X 1 CARD TO LAB, STOOL  TYPE AND SCREEN  PREPARE RBC (CROSSMATCH)  EKG None  Radiology No results found.  Procedures Procedures (including critical care time)  Medications Ordered in ED Medications  0.9 %  sodium chloride infusion (has no administration in time range)     Initial Impression / Assessment and Plan / ED Course  I have reviewed the triage vital signs and the nursing notes.  Pertinent labs & imaging results that were available during my care of the patient were reviewed by me and considered in my medical decision making (see chart for details).     5:42 PM On repeat exam patient is awake and alert, I discussed Hemoccult positive status, critically low hemoglobin value with her, she reiterates that she has not received blood transfusion ever. Patient has had type and screen, is awaiting blood transfusion given her critically abnormal hemoglobin value 5.4.  Elderly female presents with weakness, was found to have Hemoccult positive stool, hemoglobin 5.4. Some red cell indices consistency with vitamin deficiency anemia, but given her use of Plavix, Hemoccult positive status, concurrent suspicion for occult GI bleed. Patient is awake and alert, but requires transfusion of blood, admission for further evaluation and management.  Final Clinical Impressions(s) / ED Diagnoses  Occult GI bleed Symptomatic anemia  CRITICAL CARE Performed by: Carmin Muskrat Total critical care time: 35 minutes Critical care time was exclusive of separately billable procedures and treating other patients. Critical care was necessary to treat or prevent imminent or life-threatening deterioration. Critical care was time spent personally by me on the following activities: development of treatment plan with patient and/or surrogate as well as nursing, discussions with consultants, evaluation of patient's response to treatment, examination of patient, obtaining history from patient or surrogate, ordering and performing treatments and interventions, ordering  and review of laboratory studies, ordering and review of radiographic studies, pulse oximetry and re-evaluation of patient's condition.    Carmin Muskrat, MD 08/28/17 1743

## 2017-08-29 ENCOUNTER — Encounter (HOSPITAL_COMMUNITY): Payer: Self-pay | Admitting: Gastroenterology

## 2017-08-29 DIAGNOSIS — K7460 Unspecified cirrhosis of liver: Principal | ICD-10-CM

## 2017-08-29 DIAGNOSIS — D62 Acute posthemorrhagic anemia: Secondary | ICD-10-CM

## 2017-08-29 LAB — COMPREHENSIVE METABOLIC PANEL
ALT: 21 U/L (ref 0–44)
ANION GAP: 6 (ref 5–15)
AST: 38 U/L (ref 15–41)
Albumin: 2.9 g/dL — ABNORMAL LOW (ref 3.5–5.0)
Alkaline Phosphatase: 120 U/L (ref 38–126)
BUN: 18 mg/dL (ref 8–23)
CHLORIDE: 108 mmol/L (ref 98–111)
CO2: 24 mmol/L (ref 22–32)
Calcium: 8.5 mg/dL — ABNORMAL LOW (ref 8.9–10.3)
Creatinine, Ser: 1.07 mg/dL — ABNORMAL HIGH (ref 0.44–1.00)
GFR calc Af Amer: >60 mL/min (ref >60)
GFR, EST NON AFRICAN AMERICAN: 53 mL/min — AB (ref >60)
Glucose, Bld: 133 mg/dL — ABNORMAL HIGH (ref 70–99)
POTASSIUM: 4 mmol/L (ref 3.5–5.1)
Sodium: 138 mmol/L (ref 135–145)
TOTAL PROTEIN: 6.4 g/dL — AB (ref 6.5–8.1)
Total Bilirubin: 2.6 mg/dL — ABNORMAL HIGH (ref 0.3–1.2)

## 2017-08-29 LAB — CBC
HCT: 23.9 % — ABNORMAL LOW (ref 36.0–46.0)
Hemoglobin: 7.2 g/dL — ABNORMAL LOW (ref 12.0–15.0)
MCH: 24.1 pg — ABNORMAL LOW (ref 26.0–34.0)
MCHC: 30.1 g/dL (ref 30.0–36.0)
MCV: 79.9 fL (ref 78.0–100.0)
PLATELETS: 89 10*3/uL — AB (ref 150–400)
RBC: 2.99 MIL/uL — ABNORMAL LOW (ref 3.87–5.11)
RDW: 19.3 % — AB (ref 11.5–15.5)
WBC: 5.9 10*3/uL (ref 4.0–10.5)

## 2017-08-29 LAB — GLUCOSE, CAPILLARY
GLUCOSE-CAPILLARY: 121 mg/dL — AB (ref 70–99)
GLUCOSE-CAPILLARY: 128 mg/dL — AB (ref 70–99)
GLUCOSE-CAPILLARY: 149 mg/dL — AB (ref 70–99)
GLUCOSE-CAPILLARY: 185 mg/dL — AB (ref 70–99)
GLUCOSE-CAPILLARY: 213 mg/dL — AB (ref 70–99)
Glucose-Capillary: 122 mg/dL — ABNORMAL HIGH (ref 70–99)

## 2017-08-29 LAB — PROTIME-INR
INR: 1.26
Prothrombin Time: 15.7 seconds — ABNORMAL HIGH (ref 11.4–15.2)

## 2017-08-29 MED ORDER — LACTATED RINGERS IV SOLN
INTRAVENOUS | Status: AC
  Administered 2017-08-29: 18:00:00 via INTRAVENOUS

## 2017-08-29 NOTE — Consult Note (Signed)
Referring Provider: Dr. [name]  Primary Care Physician:  [name], MD Primary Gastroenterologist:  Dr. Oneida Alar   Date of Admission: 08/28/17 Date of Consultation: 08/29/17  Reason for Consultation:  Anemia, GI bleed   HPI:  Aracelie Addis is a 67 y.o. 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 Prior viral markers negative. Presented to ED yesterday with Hgb 5.4, now improved to 7.2 this morning after 2 units PRBCs. 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   Presented to the urgent care yesterday due to weakness and told to present to the ED. Was taking iron and Vitamin C about a week ago as she was told she was anemic. Saw black stool on iron. After starting medication, felt nauseated and vomited black. Believes stool was dark last night. No further evidence of possible melena since yesterday evening. No further vomiting. No abdominal pain. No dysphagia. No confusion, mental status changes. Takes Aleve and Ibuprofen occasionally. No aspirin powders. On plavix and aspirin. Has been taking a "yellow reflux pill" since 2017, but was changed to Zantac about 2 weeks ago. Feels much improved now after blood transfusion. No prior EGD or colonoscopy. As of note, in Care Everywhere, her last Hgb was 11.5 in 2017.    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 CHF (congestive heart failure) ([place])   . Cirrhosis ([place])   . GERD (gastroesophageal reflux disease)   . Hyperlipidemia   . NSTEMI (non-ST elevated myocardial infarction) ([place])    11/2014  . Portal hypertension  ([facility])     Past Surgical History:  Procedure Laterality Date  . CORONARY ANGIOPLASTY WITH STENT PLACEMENT  05/2015   DES  . none    . VALVE REPLACEMENT  05/2015   transcatheter aortic valve replacement at [facility]    Prior to Admission medications   Medication Sig Start Date End Date Taking? Authorizing Provider  aspirin 81 MG tablet Take 81 mg by mouth daily.   Yes [provider]  carvedilol (COREG) 6.25 MG tablet Take 6.25 mg by mouth 2 (two) times daily with a meal.   Yes [provider]  clopidogrel (PLAVIX) 75 MG tablet Take 75 mg by mouth every morning.  09/06/15  Yes [provider]  ferrous gluconate (FERGON) 324 MG tablet Take 324 mg by mouth every evening.  08/18/17  Yes [provider]  furosemide (LASIX) 40 MG tablet Take 40 mg by mouth every morning.    Yes [provider]  insulin detemir (LEVEMIR) 100 UNIT/ML injection Inject 36 Units into the skin every evening.   Yes [provider]  lisinopril (PRINIVIL,ZESTRIL) 10 MG tablet Take 10 mg by mouth every morning.    Yes [provider]  metFORMIN (GLUCOPHAGE) 500 MG tablet Take 500 mg by mouth 2 (two) times daily with a meal.    Yes [provider]  ranitidine (ZANTAC) 150 MG tablet Take 150 mg by mouth at bedtime.   Yes [provider]  simvastatin (ZOCOR) 40 MG tablet Take 40 mg by mouth daily at 6 PM.   Yes [provider]  vitamin C (ASCORBIC ACID) 500 MG tablet Take 500 mg by mouth every evening.   Yes [provider]    Current Facility-Administered Medications  Medication Dose Route Frequency Provider Last Rate Last Dose  . 0.9 %  sodium chloride infusion  250 mL Intravenous PRN [name], Pratik D, DO      . carvedilol (COREG) tablet 6.25 mg  6.25 mg Oral BID WC Shah, Pratik D, DO   6.25 mg at 08/29/17 0909  . insulin aspart (novoLOG) injection 0-15 Units  0-15 Units Subcutaneous TID WC [name], Pratik D, DO   2 Units at  08/29/17 0908  . insulin aspart (novoLOG) injection 0-5 Units  0-5 Units Subcutaneous QHS Heath Lark D, DO   2 Units at 08/28/17 2236  . insulin detemir (LEVEMIR) injection 18 Units  18 Units Subcutaneous QPM Heath Lark D, DO   18 Units at 08/28/17 2253  . ondansetron (ZOFRAN) tablet 4 mg  4 mg Oral Q6H PRN [name], Pratik D, DO       Or  . ondansetron (ZOFRAN) injection 4 mg  4 mg Intravenous Q6H PRN [name], Pratik D, DO      . pantoprazole (PROTONIX) 80 mg in sodium chloride 0.9 % 100 mL IVPB  80 mg Intravenous Once Shah, Pratik D, DO      . pantoprazole (PROTONIX) 80 mg in sodium chloride 0.9 % 250 mL (0.32 mg/mL) infusion  8 mg/hr Intravenous Continuous [name], Pratik D, DO 25 mL/hr at 08/28/17 2347 8 mg/hr at 08/28/17 2347  . [START ON 09/01/2017] pantoprazole (PROTONIX) injection 40 mg  40 mg Intravenous Q12H Shah, Pratik D, DO      . sodium chloride flush (NS) 0.9 % injection 3 mL  3 mL Intravenous Q12H Shah, Pratik D, DO   3 mL at 08/29/17 0909  . sodium chloride flush (NS) 0.9 % injection 3 mL  3 mL Intravenous PRN [name], Pratik D, DO   3 mL at 08/28/17 2000    Allergies as of 08/28/2017 - Review Complete 08/28/2017  Allergen Reaction Noted  . Iodinated diagnostic agents Hives 04/08/2015  . Metoprolol Itching 11/25/2016    Family History  Problem Relation Age of Onset  . Heart attack Mother   . Liver disease Other        patient not aware of any  . Heart attack Sister        died age 72  . Colon cancer Neg Hx     Social History   Socioeconomic History  . Marital status: Unknown    Spouse name: Not on file  . Number of children: 1  . Years of education: Not on file  . Highest education level: Not on file  Occupational History  . Occupation: work at [employer]  . Financial resource strain: Not on file  . Food insecurity:    Worry: Not on file    Inability: Not on file  . Transportation needs:    Medical: Not on file    Non-medical: Not on file  Tobacco  Use  . Smoking status: Current Some Day Smoker    Packs/day: 0.25    Types: Cigarettes  . Smokeless tobacco: Never Used  Substance and Sexual Activity  . Alcohol use: Not Currently    Alcohol/week: 0.0 oz    Comment: pint in 24 hours once per week, stopped 3 years ago  . Drug use: No  . Sexual activity: Not on file  Lifestyle  .  Physical activity:    Days per week: Not on file    Minutes per session: Not on file  . Stress: Not on file  Relationships  . Social connections:    Talks on phone: Not on file    Gets together: Not on file    Attends religious service: Not on file    Active member of club or organization: Not on file    Attends meetings of clubs or organizations: Not on file    Relationship status: Not on file  . Intimate partner violence:    Fear of current or ex partner: Not on file    Emotionally abused: Not on file    Physically abused: Not on file    Forced sexual activity: Not on file  Other Topics Concern  . Not on file  Social History Narrative  . Not on file    Review of Systems: Gen: see HPI  CV: Denies chest pain, heart palpitations, syncope, edema  Resp: Denies shortness of breath with rest, cough, wheezing GI: see HPI  GU : Denies urinary burning, urinary frequency, urinary incontinence.  MS: Denies joint pain,swelling, cramping Derm: Denies rash, itching, dry skin Psych: Denies depression, anxiety,confusion, or memory loss Heme: see HPI   Physical Exam: Vital signs in last 24 hours: Temp:  [98.2 F (36.8 C)-98.8 F (37.1 C)] 98.8 F (37.1 C) (07/09 0424) Pulse Rate:  [101-115] 101 (07/09 0424) Resp:  [15-18] 18 (07/09 0424) BP: (105-130)/(39-93) 113/92 (07/09 0424) SpO2:  [95 %-100 %] 100 % (07/09 0424) Weight:  [192 lb (87.1 kg)-198 lb 6.4 oz (90 kg)] 198 lb 6.4 oz (90 kg) (07/08 2000) Last BM Date: 08/26/17 General:   Alert,  Well-developed, well-nourished, pleasant and cooperative in NAD Head:  Normocephalic and atraumatic. Eyes:   Sclera clear, no icterus.   Conjunctiva pink. Ears:  Normal auditory acuity. Nose:  No deformity, discharge,  or lesions. Mouth:  No deformity or lesions, dentition normal. Lungs:  Clear throughout to auscultation.   Heart:  S1 S2 present with notable systolic murmur  Abdomen:  Soft, nontender and nondistended. No masses, hepatosplenomegaly or hernias noted. Normal bowel sounds, without guarding, and without rebound.   Rectal:  Deferred  Msk:  Symmetrical without gross deformities. Normal posture. Extremities:  Without edema. Neurologic:  Alert and  oriented x4 Psych:  Alert and cooperative. Normal mood and affect.  Intake/Output from previous day: 07/08 0701 - 07/09 0700 In: 950 [P.O.:240; Blood:710] Out: -  Intake/Output this shift: No intake/output data recorded.  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 Endorses use of Aleve and Ibuprofen intermittently, and it is unclear if she has been  on a PPI long-term. Recently told she had iron deficiency anemia as an outpatient and started on oral iron, with subsequent black stool, N/V. Unclear if she has had true melena this admission, but last evidence of black stool was yesterday evening per patient. No hematemesis or frank bleeding. Hemodynamically stable and does not appear to have  acute variceal bleeding; in this scenario would favor NSAID-related etiology such as PUD. No prior EGD or colonoscopy; unknown if any varices present. Hgb improved s/p 2 units PRBCs.   Will need EGD this admission, timing to be discussed with Dr. Gala Romney. As long as not further overt GI bleeding, can hold off on empiric antibiotics in setting of GI bleed and cirrhosis. I have asked nursing staff to contact me if any evidence of overt bleeding. Hold on octreotide.   I discussed with patient absolute avoidance of NSAIDs in future. 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 Question sidebranch IPMN on imaging and no further surveillance completed.   Plan: NPO for now EGD this admission: timing to be determined with Dr. Gala Romney PPI infusion started at admission Monitor for overt GI bleeding Hold on empiric antibiotic coverage now but nursing staff to call if any changes Absolute avoidance of NSAIDs going forward. Plavix on hold Outpatient cirrhosis care and further evaluation of pancreatic cyst in an elective setting Outpatient colonoscopy unless no obvious source for heme positive stool and anemia on EGD  Annitta Needs, PhD, ANP-BC [facility]     LOS: 1 day    08/29/2017, 10:12 AM

## 2017-08-29 NOTE — Progress Notes (Addendum)
PROGRESS NOTE    Cheyenne Hicks  ONG:[id number] DOB: 13-Nov-1950 DOA: 08/28/2017 PCP: [name], MD   Brief Narrative:   Cheyenne Hicks is a 67 y.o. female with medical history significant for CAD with prior stent on Plavix and aspirin, cirrhosis with portal hypertension, GERD, and dyslipidemia, who woke up this morning feeling quite weak with some transient blurry vision as well as confusion, dizziness, and lightheadedness.  She went to the urgent care facility and was told that her hemoglobin levels were very low and was told to come to the ED for further evaluation. She was admitted with acute symptomatic blood loss anemia and has been transfused 2U PRBCs with improvement in her H/H levels    Assessment & Plan:   Principal Problem:   Acute blood loss anemia Active Problems:   Cirrhosis (HCC)   GERD (gastroesophageal reflux disease)   CAD (coronary artery disease)   Portal hypertension (HCC)   Diabetes (HCC)   GI bleed   1. Acute symptomatic blood loss anemia-s/p PRBC transfusion.  Suspect secondary to GI bleed with recent history of hematemesis and dark tarry stools along with finding of positive FOBT.  Patient noted to have prior history of iron deficiency anemia.  Patient is hemodynamically stable.  Continue on Protonix drip in order and repeat CBC in a.m.  GI consultation for endoscopy in am. Seen today with plans to continue on CLD and NPO after MN. 2. GERD.  Maintain on PPI drip. 3. CAD.  Hold antiplatelet agents and Plavix.  Maintain on SCDs. 4. Type 2 diabetes.  Withhold metformin and half the dose of Lantus with SSI.  No severe hyperglycemia noted at this time. 5. HTN. Hold Coreg due to soft BP readings currently. Maintain on gentle IVF. 6. Cirrhosis with portal hypertension.  Noted. 7. Thrombocytopenia.  Likely secondary to above.    DVT prophylaxis:SCDs Code Status: Full Family Communication: None Disposition Plan: GI evaluation for endoscopy   Consultants:    GI  Procedures:   None  Antimicrobials:   None   Subjective: Patient seen and evaluated today with no new acute complaints or concerns. No acute concerns or events noted overnight. She did have one dark BM since admission. She is very hungry and would like to eat.  Objective: Vitals:   08/28/17 2358 08/29/17 0030 08/29/17 0424 08/29/17 1417  BP: (!) 118/53 (!) 105/39 (!) 113/92 (!) 89/77  Pulse: (!) 101 (!) 102 (!) 101 82  Resp: 18 18 18 20   Temp: 98.5 F (36.9 C) 98.3 F (36.8 C) 98.8 F (37.1 C)   TempSrc: Oral Oral Oral   SpO2: 99% 100% 100% 100%  Weight:      Height:        Intake/Output Summary (Last 24 hours) at 08/29/2017 1707 Last data filed at 08/29/2017 1509 Gross per 24 hour  Intake 1574.17 ml  Output -  Net 1574.17 ml   Filed Weights   08/28/17 1425 08/28/17 2000  Weight: 87.1 kg (192 lb) 90 kg (198 lb 6.4 oz)    Examination:  General exam: Appears calm and comfortable  Respiratory system: Clear to auscultation. Respiratory effort normal. Cardiovascular system: S1 & S2 heard, RRR. No JVD, murmurs, rubs, gallops or clicks. No pedal edema. Gastrointestinal system: Abdomen is nondistended, soft and nontender. No organomegaly or masses felt. Normal bowel sounds heard. Central nervous system: Alert and oriented. No focal neurological deficits. Extremities: Symmetric 5 x 5 power. Skin: No rashes, lesions or ulcers Psychiatry: Judgement and insight  appear normal. Mood & affect appropriate.     Data Reviewed: I have personally reviewed following labs and imaging studies  CBC: Recent Labs  Lab 08/28/17 1454 08/29/17 0507  WBC 5.3 5.9  NEUTROABS 3.6  --   HGB 5.4* 7.2*  HCT 19.1* 23.9*  MCV 77.6* 79.9  PLT 87* 89*   Basic Metabolic Panel: Recent Labs  Lab 08/28/17 1454 08/29/17 0507  NA 135 138  K 3.8 4.0  CL 103 108  CO2 25 24  GLUCOSE 158* 133*  BUN 20 18  CREATININE 1.15* 1.07*  CALCIUM 8.7* 8.5*   GFR: Estimated Creatinine  Clearance: 55.8 mL/min (A) (by C-G formula based on SCr of 1.07 mg/dL (H)). Liver Function Tests: Recent Labs  Lab 08/29/17 0507  AST 38  ALT 21  ALKPHOS 120  BILITOT 2.6*  PROT 6.4*  ALBUMIN 2.9*   No results for input(s): LIPASE, AMYLASE in the last 168 hours. No results for input(s): AMMONIA in the last 168 hours. Coagulation Profile: Recent Labs  Lab 08/29/17 0834  INR 1.26   Cardiac Enzymes: No results for input(s): CKTOTAL, CKMB, CKMBINDEX, TROPONINI in the last 168 hours. BNP (last 3 results) No results for input(s): PROBNP in the last 8760 hours. HbA1C: No results for input(s): HGBA1C in the last 72 hours. CBG: Recent Labs  Lab 08/28/17 2152 08/29/17 0013 08/29/17 0439 08/29/17 0754 08/29/17 1137  GLUCAP 242* 185* 149* 128* 121*   Lipid Profile: No results for input(s): CHOL, HDL, LDLCALC, TRIG, CHOLHDL, LDLDIRECT in the last 72 hours. Thyroid Function Tests: No results for input(s): TSH, T4TOTAL, FREET4, T3FREE, THYROIDAB in the last 72 hours. Anemia Panel: No results for input(s): VITAMINB12, FOLATE, FERRITIN, TIBC, IRON, RETICCTPCT in the last 72 hours. Sepsis Labs: No results for input(s): PROCALCITON, LATICACIDVEN in the last 168 hours.  No results found for this or any previous visit (from the past 240 hour(s)).       Radiology Studies: No results found.      Scheduled Meds: . carvedilol  6.25 mg Oral BID WC  . insulin aspart  0-15 Units Subcutaneous TID WC  . insulin aspart  0-5 Units Subcutaneous QHS  . insulin detemir  18 Units Subcutaneous QPM  . [START ON 09/01/2017] pantoprazole  40 mg Intravenous Q12H  . sodium chloride flush  3 mL Intravenous Q12H   Continuous Infusions: . sodium chloride    . pantoprazole (PROTONIX) IVPB    . pantoprozole (PROTONIX) infusion 8 mg/hr (08/29/17 1150)     LOS: 1 day    Time spent: 30 minutes    Jordyan Hardiman Darleen Crocker, DO [facility] Pager [phone]  If 7PM-7AM, please contact  night-coverage [email or web address] Password [email or web address] 08/29/2017, 5:07 PM

## 2017-08-30 DIAGNOSIS — I2510 Atherosclerotic heart disease of native coronary artery without angina pectoris: Secondary | ICD-10-CM

## 2017-08-30 DIAGNOSIS — K219 Gastro-esophageal reflux disease without esophagitis: Secondary | ICD-10-CM

## 2017-08-30 DIAGNOSIS — E118 Type 2 diabetes mellitus with unspecified complications: Secondary | ICD-10-CM

## 2017-08-30 DIAGNOSIS — Z794 Long term (current) use of insulin: Secondary | ICD-10-CM

## 2017-08-30 DIAGNOSIS — R195 Other fecal abnormalities: Secondary | ICD-10-CM

## 2017-08-30 DIAGNOSIS — D649 Anemia, unspecified: Secondary | ICD-10-CM

## 2017-08-30 DIAGNOSIS — K766 Portal hypertension: Secondary | ICD-10-CM

## 2017-08-30 LAB — BASIC METABOLIC PANEL
Anion gap: 6 (ref 5–15)
BUN: 12 mg/dL (ref 8–23)
CO2: 23 mmol/L (ref 22–32)
Calcium: 8.3 mg/dL — ABNORMAL LOW (ref 8.9–10.3)
Chloride: 108 mmol/L (ref 98–111)
Creatinine, Ser: 1.04 mg/dL — ABNORMAL HIGH (ref 0.44–1.00)
GFR calc Af Amer: >60 mL/min (ref >60)
GFR calc non Af Amer: 55 mL/min — ABNORMAL LOW (ref >60)
Glucose, Bld: 106 mg/dL — ABNORMAL HIGH (ref 70–99)
Potassium: 3.9 mmol/L (ref 3.5–5.1)
Sodium: 137 mmol/L (ref 135–145)

## 2017-08-30 LAB — TYPE AND SCREEN
ABO/RH(D): A POS
Antibody Screen: NEGATIVE
UNIT DIVISION: 0
UNIT DIVISION: 0

## 2017-08-30 LAB — CBC
HCT: 23.9 % — ABNORMAL LOW (ref 36.0–46.0)
HEMOGLOBIN: 7.1 g/dL — AB (ref 12.0–15.0)
MCH: 23.8 pg — AB (ref 26.0–34.0)
MCHC: 29.7 g/dL — AB (ref 30.0–36.0)
MCV: 80.2 fL (ref 78.0–100.0)
Platelets: 83 10*3/uL — ABNORMAL LOW (ref 150–400)
RBC: 2.98 MIL/uL — ABNORMAL LOW (ref 3.87–5.11)
RDW: 19 % — AB (ref 11.5–15.5)
WBC: 5.4 10*3/uL (ref 4.0–10.5)

## 2017-08-30 LAB — BPAM RBC
BLOOD PRODUCT EXPIRATION DATE: 201907132359
Blood Product Expiration Date: 201907132359
ISSUE DATE / TIME: 201907090011
ISSUE DATE / TIME: 201907082034
Unit Type and Rh: 6200
Unit Type and Rh: 6200

## 2017-08-30 LAB — GLUCOSE, CAPILLARY
GLUCOSE-CAPILLARY: 105 mg/dL — AB (ref 70–99)
GLUCOSE-CAPILLARY: 110 mg/dL — AB (ref 70–99)
GLUCOSE-CAPILLARY: 123 mg/dL — AB (ref 70–99)
GLUCOSE-CAPILLARY: 165 mg/dL — AB (ref 70–99)
Glucose-Capillary: 104 mg/dL — ABNORMAL HIGH (ref 70–99)
Glucose-Capillary: 124 mg/dL — ABNORMAL HIGH (ref 70–99)

## 2017-08-30 MED ORDER — KETOROLAC TROMETHAMINE 15 MG/ML IJ SOLN
15.0000 mg | Freq: Once | INTRAMUSCULAR | Status: AC
  Administered 2017-08-30: 15 mg via INTRAVENOUS
  Filled 2017-08-30: qty 1

## 2017-08-30 MED ORDER — ACETAMINOPHEN 325 MG PO TABS: 650.0000 mg | ORAL_TABLET | Freq: Four times a day (QID) | ORAL | Status: DC | PRN

## 2017-08-30 NOTE — Progress Notes (Signed)
Subjective: Feels well, denies abdominal pain, N/V, hematochezia. Had a bowel movement yesterday which she states was black although nursing staff felt it was dark brown. Is ready for procedure, though it was today. EGD scheduled for tomorrow. She verbalized understanding.  Objective: Vital signs in last 24 hours: Temp:  [98.5 F (36.9 C)-98.6 F (37 C)] 98.5 F (36.9 C) (07/10 0500) Pulse Rate:  [74-82] 74 (07/10 0500) Resp:  [18-20] 18 (07/10 0500) BP: (89-112)/(76-78) 112/78 (07/10 0500) SpO2:  [99 %-100 %] 99 % (07/10 0500) Last BM Date: 08/28/17 General:   Alert and oriented, pleasant Head:  Normocephalic and atraumatic. Eyes:  No icterus, sclera clear. Conjuctiva pink.  Heart:  S1, S2 present, no murmurs noted.  Lungs: Clear to auscultation bilaterally, without wheezing, rales, or rhonchi.  Abdomen:  Bowel sounds present, soft, non-tender, non-distended. No HSM or hernias noted. No rebound or guarding. Msk:  Symmetrical without gross deformities. Pulses:  Normal bilateral DP pulses noted. Extremities:  Without clubbing or edema. Neurologic:  Alert and  oriented x4;  grossly normal neurologically. Skin:  Warm and dry, intact without significant lesions.  Psych:  Alert and cooperative. Normal mood and affect.  Intake/Output from previous day: 07/09 0701 - 07/10 0700 In: 2510 [P.O.:960; I.V.:1550] Out: 350 [Urine:350] Intake/Output this shift: No intake/output data recorded.  Lab Results: Recent Labs    08/28/17 1454 08/29/17 0507 08/30/17 0355  WBC 5.3 5.9 5.4  HGB 5.4* 7.2* 7.1*  HCT 19.1* 23.9* 23.9*  PLT 87* 89* 83*   BMET Recent Labs    08/28/17 1454 08/29/17 0507 08/30/17 0355  NA 135 138 137  K 3.8 4.0 3.9  CL 103 108 108  CO2 25 24 23   GLUCOSE 158* 133* 106*  BUN 20 18 12   CREATININE 1.15* 1.07* 1.04*  CALCIUM 8.7* 8.5* 8.3*   LFT Recent Labs    08/29/17 0507  PROT 6.4*  ALBUMIN 2.9*  AST 38  ALT 21  ALKPHOS 120  BILITOT 2.6*    PT/INR Recent Labs    08/29/17 0834  LABPROT 15.7*  INR 1.26   Hepatitis Panel No results for input(s): HEPBSAG, HCVAB, HEPAIGM, HEPBIGM in the last 72 hours.   Studies/Results: No results found.  Assessment: 67 year old female with history of cirrhosis (MELD Na 15)  likely secondary to alcohol +/- fatty liver disease and lost to follow-up, presenting with acute blood loss anemia, heme positive stool, on aspirin and Plavix. Endorses use of Aleve and Ibuprofen intermittently, and it is unclear if she has been on a PPI long-term. Recently told she had iron deficiency anemia as an outpatient and started on oral iron, with subsequent black stool, N/V. Unclear if she has had true melena this admission, but last evidence of black stool was yesterday evening per patient. No hematemesis or frank bleeding. Hemodynamically stable and does not appear to have acute variceal bleeding; in this scenario would favor NSAID-related etiology such as PUD. No prior EGD or colonoscopy; unknown if any varices present. Hgb improved s/p 2 units PRBCs.   Will need EGD this admission, scheduled with Dr. Gala Romney tomorrow 08/31/17. As long as not further overt GI bleeding, can hold off on empiric antibiotics in setting of GI bleed and cirrhosis. I have asked nursing staff to contact us if any evidence of overt bleeding. Hold on octreotide.   Absolute avoidance of NSAIDs in future was discussed with the patient. 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 Question sidebranch IPMN on imaging and no further surveillance completed.  Today her hgb remains stable at 7.1 (7.2 yesterday). Plt depressed but stable at 83. BMP stable. She is feeling well. Feels she had black stools yesterday/today, but nuring staff felt they were dark brown. No abdominal pain, N/V, hematochezia, hematemesis. No overt GI bleed (consistent with stable hgb as per  above).  Plan: 1. Check CBC tomorrow 2. Continue clear liquids today, NPO after midnight 3. Monitor for overt GI bleed (if occurs, can consider prophylactic abx in setting of cirrhosis) 4. Continue PPI infusion 5. Supportive measures 6. EGD tomorrow as planned. 7. May need eventual outpatient colonoscopy 8. Will need outpatient ongoing cirrhosis care   Thank you for allowing [place] to participate in the care of [place], DNP, AGNP-C Adult & Gerontological Nurse Practitioner [facility]     LOS: 2 days    08/30/2017, 8:31 AM

## 2017-08-30 NOTE — Progress Notes (Signed)
PROGRESS NOTE    Cheyenne Hicks  ZLD:[id number] DOB: September 02, 1950 DOA: 08/28/2017 PCP: [name], MD   Brief Narrative:   Cheyenne Hicks is a 67 y.o. female with medical history significant for CAD with prior stent on Plavix and aspirin, cirrhosis with portal hypertension, GERD, and dyslipidemia, who woke up this morning feeling quite weak with some transient blurry vision as well as confusion, dizziness, and lightheadedness.  She went to the urgent care facility and was told that her hemoglobin levels were very low and was told to come to the ED for further evaluation. She was admitted with acute symptomatic blood loss anemia and has been transfused 2U PRBCs with improvement in her H/H levels    Assessment & Plan:   Principal Problem:   Acute blood loss anemia Active Problems:   Cirrhosis (HCC)   GERD (gastroesophageal reflux disease)   CAD (coronary artery disease)   Portal hypertension (HCC)   Diabetes (HCC)   GI bleed   Occult GI bleeding   Symptomatic anemia   1. Acute symptomatic blood loss anemia-s/p PRBC transfusion.  Suspect secondary to GI bleed with recent history of hematemesis and dark tarry stools along with finding of positive FOBT.  Patient noted to have prior history of iron deficiency anemia.  Patient is hemodynamically stable.  CLD and NPO after midnight. Follow CBC in am; might need another unit of PRBC prior to discharge.  2. GERD. continue PPI 3. CAD.  Hold antiplatelet agents and Plavix.  Maintain on SCDs. denies CP. 4. Type 2 diabetes.  Withhold metformin and half the dose of Lantus with SSI.  follow CBG's and adjust regimen as needed  5. HTN. Continue holding antihypertensive regimen  6. Cirrhosis with portal hypertension. appears stable. Continue outpatient follow up with GI. 7. Thrombocytopenia.  Likely secondary to above. Will monitor trend.    DVT prophylaxis:SCDs Code Status: Full Family Communication: None Disposition Plan: GI evaluation  with planned endoscopy on 7/11   Consultants:   GI  Procedures:   None  Antimicrobials:   None   Subjective: No chest pain, no shortness of breath, no nausea, no vomiting.  Patient denies abdominal pain.  No further black stools of bright red blood per rectum reported.   Objective: Vitals:   08/29/17 1900 08/30/17 0500 08/30/17 1321 08/30/17 1601  BP: 104/76 112/78 (!) 101/47 137/60  Pulse: 76 74 85 87  Resp: 19 18 18 17   Temp: 98.6 F (37 C) 98.5 F (36.9 C) 98.1 F (36.7 C)   TempSrc: Oral Oral Oral   SpO2: 100% 99% 100%   Weight:      Height:        Intake/Output Summary (Last 24 hours) at 08/30/2017 1851 Last data filed at 08/30/2017 1700 Gross per 24 hour  Intake 2154.58 ml  Output 350 ml  Net 1804.58 ml   Filed Weights   08/28/17 1425 08/28/17 2000  Weight: 87.1 kg (192 lb) 90 kg (198 lb 6.4 oz)    Examination: General exam: Alert, awake, oriented x 3; denies nausea, vomiting and abd pain. Respiratory system: Clear to auscultation. Respiratory effort normal. Cardiovascular system:RRR. No murmurs, rubs, gallops. Gastrointestinal system: Abdomen is nondistended, soft and nontender. No organomegaly or masses felt. Normal bowel sounds heard. Central nervous system: Alert and oriented. No focal neurological deficits. Extremities: No Cyanosis or clubbing; trace edema bilaterally. Skin: No rashes, lesions or ulcers Psychiatry: Judgement and insight appear normal. Mood & affect appropriate.    Data Reviewed: I have  personally reviewed following labs and imaging studies  CBC: Recent Labs  Lab 08/28/17 1454 08/29/17 0507 08/30/17 0355  WBC 5.3 5.9 5.4  NEUTROABS 3.6  --   --   HGB 5.4* 7.2* 7.1*  HCT 19.1* 23.9* 23.9*  MCV 77.6* 79.9 80.2  PLT 87* 89* 83*   Basic Metabolic Panel: Recent Labs  Lab 08/28/17 1454 08/29/17 0507 08/30/17 0355  NA 135 138 137  K 3.8 4.0 3.9  CL 103 108 108  CO2 25 24 23   GLUCOSE 158* 133* 106*  BUN 20 18 12     CREATININE 1.15* 1.07* 1.04*  CALCIUM 8.7* 8.5* 8.3*   GFR: Estimated Creatinine Clearance: 57.4 mL/min (A) (by C-G formula based on SCr of 1.04 mg/dL (H)).   Liver Function Tests: Recent Labs  Lab 08/29/17 0507  AST 38  ALT 21  ALKPHOS 120  BILITOT 2.6*  PROT 6.4*  ALBUMIN 2.9*   Coagulation Profile: Recent Labs  Lab 08/29/17 0834  INR 1.26   CBG: Recent Labs  Lab 08/30/17 0014 08/30/17 0601 08/30/17 0740 08/30/17 1118 08/30/17 1613  GLUCAP 123* 110* 104* 124* 105*    Scheduled Meds: . insulin aspart  0-15 Units Subcutaneous TID WC  . insulin aspart  0-5 Units Subcutaneous QHS  . insulin detemir  18 Units Subcutaneous QPM  . [START ON 09/01/2017] pantoprazole  40 mg Intravenous Q12H  . sodium chloride flush  3 mL Intravenous Q12H   Continuous Infusions: . sodium chloride    . pantoprazole (PROTONIX) IVPB    . pantoprozole (PROTONIX) infusion 8 mg/hr (08/29/17 2121)     LOS: 2 days    Time spent: 30 minutes    Barton Dubois, MD [facility] Pager [phone]  If 7PM-7AM, please contact night-coverage [email or web address] Password [facility] 08/30/2017, 6:51 PM

## 2017-08-30 NOTE — Plan of Care (Signed)
Patient at 100% Ra.

## 2017-08-31 ENCOUNTER — Other Ambulatory Visit: Payer: Self-pay

## 2017-08-31 ENCOUNTER — Encounter (HOSPITAL_COMMUNITY)
Admission: EM | Disposition: A | Discharge status: Home / Self Care | Payer: Self-pay | Source: Home / Self Care | Attending: Internal Medicine

## 2017-08-31 ENCOUNTER — Inpatient Hospital Stay (HOSPITAL_COMMUNITY): Payer: Medicare HMO | Admitting: Anesthesiology

## 2017-08-31 ENCOUNTER — Encounter (HOSPITAL_COMMUNITY): Payer: Self-pay | Admitting: *Deleted

## 2017-08-31 HISTORY — PX: ESOPHAGOGASTRODUODENOSCOPY (EGD) WITH PROPOFOL: SHX5813

## 2017-08-31 HISTORY — PX: BIOPSY: SHX5522

## 2017-08-31 LAB — CBC
HCT: 24.2 % — ABNORMAL LOW (ref 36.0–46.0)
Hemoglobin: 7.3 g/dL — ABNORMAL LOW (ref 12.0–15.0)
MCH: 24.1 pg — AB (ref 26.0–34.0)
MCHC: 30.2 g/dL (ref 30.0–36.0)
MCV: 79.9 fL (ref 78.0–100.0)
PLATELETS: 88 10*3/uL — AB (ref 150–400)
RBC: 3.03 MIL/uL — ABNORMAL LOW (ref 3.87–5.11)
RDW: 19.6 % — AB (ref 11.5–15.5)
WBC: 4.8 10*3/uL (ref 4.0–10.5)

## 2017-08-31 LAB — HIV 1/2 AB DIFFERENTIATION
HIV 1 AB: NEGATIVE
HIV 2 AB: NEGATIVE
Note: NEGATIVE

## 2017-08-31 LAB — GLUCOSE, CAPILLARY
GLUCOSE-CAPILLARY: 95 mg/dL (ref 70–99)
GLUCOSE-CAPILLARY: 119 mg/dL — AB (ref 70–99)
GLUCOSE-CAPILLARY: 154 mg/dL — AB (ref 70–99)
Glucose-Capillary: 113 mg/dL — ABNORMAL HIGH (ref 70–99)
Glucose-Capillary: 120 mg/dL — ABNORMAL HIGH (ref 70–99)
Glucose-Capillary: 163 mg/dL — ABNORMAL HIGH (ref 70–99)

## 2017-08-31 LAB — HIV ANTIBODY (ROUTINE TESTING W REFLEX): HIV Screen 4th Generation wRfx: REACTIVE — AB

## 2017-08-31 LAB — RNA QUALITATIVE: HIV 1 RNA Qualitative: 1

## 2017-08-31 SURGERY — ESOPHAGOGASTRODUODENOSCOPY (EGD) WITH PROPOFOL
Anesthesia: Monitor Anesthesia Care

## 2017-08-31 MED ORDER — PROPOFOL 10 MG/ML IV BOLUS
INTRAVENOUS | Status: DC | PRN
  Administered 2017-08-31 (×6): 20 mg via INTRAVENOUS

## 2017-08-31 MED ORDER — MEPERIDINE HCL 100 MG/ML IJ SOLN: 6.2500 mg | INTRAMUSCULAR | Status: DC | PRN

## 2017-08-31 MED ORDER — HYDROMORPHONE HCL 1 MG/ML IJ SOLN: 0.2500 mg | INTRAMUSCULAR | Status: DC | PRN

## 2017-08-31 MED ORDER — PANTOPRAZOLE SODIUM 40 MG PO TBEC
40.0000 mg | DELAYED_RELEASE_TABLET | Freq: Every day | ORAL | Status: DC
  Administered 2017-08-31 – 2017-09-01 (×2): 40 mg via ORAL
  Filled 2017-08-31 (×3): qty 1

## 2017-08-31 MED ORDER — ASPIRIN EC 81 MG PO TBEC
81.0000 mg | DELAYED_RELEASE_TABLET | Freq: Every day | ORAL | Status: DC
  Administered 2017-09-01: 81 mg via ORAL
  Filled 2017-08-31 (×2): qty 1

## 2017-08-31 MED ORDER — HYDROCODONE-ACETAMINOPHEN 7.5-325 MG PO TABS: 1.0000 | ORAL_TABLET | Freq: Once | ORAL | Status: DC | PRN

## 2017-08-31 MED ORDER — NADOLOL 20 MG PO TABS
20.0000 mg | ORAL_TABLET | Freq: Every day | ORAL | Status: DC
  Administered 2017-09-01: 20 mg via ORAL
  Filled 2017-08-31 (×4): qty 1

## 2017-08-31 MED ORDER — SODIUM CHLORIDE 0.9 % IV SOLN: INTRAVENOUS | Status: DC

## 2017-08-31 MED ORDER — PROPOFOL 500 MG/50ML IV EMUL
INTRAVENOUS | Status: DC | PRN
  Administered 2017-08-31: 150 ug/kg/min via INTRAVENOUS

## 2017-08-31 MED ORDER — LACTATED RINGERS IV SOLN
INTRAVENOUS | Status: DC
  Administered 2017-08-31: 14:00:00 via INTRAVENOUS

## 2017-08-31 MED ORDER — PROMETHAZINE HCL 25 MG/ML IJ SOLN: 6.2500 mg | INTRAMUSCULAR | Status: DC | PRN

## 2017-08-31 MED ORDER — LACTATED RINGERS IV SOLN: INTRAVENOUS | Status: DC

## 2017-08-31 NOTE — Anesthesia Preprocedure Evaluation (Signed)
Anesthesia Evaluation  Patient identified by MRN, date of birth, ID band Patient awake    Reviewed: Allergy & Precautions, H&P , NPO status , Patient's Chart, lab work & pertinent test results, reviewed documented beta blocker date and time   Airway Mallampati: II  TM Distance: >3 FB Neck ROM: full    Dental no notable dental hx. (+) Poor Dentition, Dental Advidsory Given   Pulmonary neg pulmonary ROS, Current Smoker,    Pulmonary exam normal breath sounds clear to auscultation       Cardiovascular Exercise Tolerance: Good hypertension, + CAD, + Past MI and +CHF  negative cardio ROS   Rhythm:regular Rate:Normal     Neuro/Psych negative neurological ROS  negative psych ROS   GI/Hepatic negative GI ROS, Neg liver ROS, GERD  ,  Endo/Other  negative endocrine ROSdiabetes, Type 2  Renal/GU negative Renal ROS  negative genitourinary   Musculoskeletal   Abdominal   Peds  Hematology negative hematology ROS (+) anemia ,   Anesthesia Other Findings Portal HTN and cirrhosis GI Bleed with anemia MVR replacement H/O alcohol abuse.  States d/c ETOH 2-3 years ago  Reproductive/Obstetrics negative OB ROS                             Anesthesia Physical Anesthesia Plan  ASA: IV  Anesthesia Plan: MAC   Post-op Pain Management:    Induction:   PONV Risk Score and Plan:   Airway Management Planned:   Additional Equipment:   Intra-op Plan:   Post-operative Plan:   Informed Consent: I have reviewed the patients History and Physical, chart, labs and discussed the procedure including the risks, benefits and alternatives for the proposed anesthesia with the patient or authorized representative who has indicated his/her understanding and acceptance.   Dental Advisory Given  Plan Discussed with: CRNA and Anesthesiologist  Anesthesia Plan Comments:         Anesthesia Quick Evaluation

## 2017-08-31 NOTE — Progress Notes (Signed)
Patient seen and examined in short stay. Hemoglobin holding steady at 7.3 platelets 88,000. We'll proceed with diagnostic EGD per plan. The risks, benefits, limitations, alternatives and imponderables have been reviewed with the patient. Potential for esophageal dilation, biopsy, etc. have also been reviewed.  Questions have been answered. All parties agreeable.  Further recommendations to follow.

## 2017-08-31 NOTE — Op Note (Addendum)
[facility] Patient Name: Cheyenne Hicks Procedure Date: 08/31/2017 2:33 PM MRN: [id number] Date of Birth: 01-13-1951 Attending MD: Norvel Richards , MD CSN: [id number] Age: 67 Admit Type: Inpatient Procedure:                Upper GI endoscopy Indications:              Melena Providers:                Norvel Richards, MD, Janeece Riggers, RN, Nelma Rothman, Technician Referring MD:              Medicines:                Propofol per Anesthesia Complications:            No immediate complications. Estimated Blood Loss:     Estimated blood loss was minimal. Procedure:                Pre-Anesthesia Assessment:                           - Prior to the procedure, a History and Physical                            was performed, and patient medications and                            allergies were reviewed. The patient's tolerance of                            previous anesthesia was also reviewed. The risks                            and benefits of the procedure and the sedation                            options and risks were discussed with the patient.                            All questions were answered, and informed consent                            was obtained. Prior Anticoagulants: The patient                            last took Plavix (clopidogrel) 5 days prior to the                            procedure. ASA Grade Assessment: III - A patient                            with severe systemic disease. After reviewing the  risks and benefits, the patient was deemed in                            satisfactory condition to undergo the procedure.                           After obtaining informed consent, the endoscope was                            passed under direct vision. Throughout the                            procedure, the patient's blood pressure, pulse, and                            oxygen saturations were  monitored continuously. The                            [id number]-H190 ([id number]) scope was introduced through the                            mouth, and advanced to the second part of duodenum.                            The upper GI endoscopy was accomplished without                            difficulty. The patient tolerated the procedure                            well. Scope In: 2:47:59 PM Scope Out: 3:03:50 PM Total Procedure Duration: 0 hours 15 minutes 51 seconds  Findings:      Grade I varices were found in the lower third of the esophagus. 3       columns - grade 1. No bleeding stigmata. Noncritical Schatzki's ring.;      A small hiatal hernia was present.      Mild portal hypertensive gastropathy was found in the gastric body.       Scattered gastric erosions. No ulcer or infiltrating process. (3) 5 mm       AVMs in the gastric body without bleeding stigmata.      Some nodularity of bulb; Otherwise, The duodenal bulb and second portion       of the duodenum were normal.. 2 small gastric biopsies taken. 3 AVMs       ablated with multiple applications of the APC circular probe 25 J       eachapplication. Impression:               - Grade I esophageal varices. innocent appearing                           - Small hiatal hernia. AVMs ablated as described                            above.                           -  Portal hypertensive gastropathy. gastric erosions.                           - Normal duodenal bulb and second portion of the                            duodenum. Moderate Sedation:      Moderate (conscious) sedation was personally administered by an       anesthesia professional. The following parameters were monitored: oxygen       saturation, heart rate, blood pressure, respiratory rate, EKG, adequacy       of pulmonary ventilation, and response to care. Total physician       intraservice time was 23 minutes. Recommendation:           - Return patient to hospital ward for  observation.                           - Cardiac diet.                           - Continue present medications. Protonix 40 mg once                            daily should suffice. May continue 81 mg Aspirin.                            Recommend Plavix be held another 7 days before                            being restarted. ABSOLUTELY AVOID ALL OTHER NSAIDS                            SUCH AS ALEVE OR ADVIL, ETC. AT HOME.                           - recommend a non-selective beta blocker as primary                            prophylaxis against the first future variceal                            hemorrhage. Recommend input from cardiology to see                            if she can tolerate this treatment regimen. If she                            can tolerate a nonselective beta blocker, no future                            EGD needed; if not, recommend repeat EGD in 18                            months. Follow-up pathology.  discussed my findings and recommendations at length                            with the patient's son, Charna Archer. I have                            explained to patient if further GI bleeding, small                            bowel evaluation may be needed. Procedure Code(s):        --- Professional ---                           [phone], Esophagogastroduodenoscopy, flexible,                            transoral; diagnostic, including collection of                            specimen(s) by brushing or washing, when performed                            (separate procedure) Diagnosis Code(s):        --- Professional ---                           I85.00, Esophageal varices without bleeding                           K44.9, Diaphragmatic hernia without obstruction or                            gangrene                           K76.6, Portal hypertension                           K31.89, Other diseases of stomach and duodenum                            K92.1, Melena (includes Hematochezia) CPT copyright 2017 [employer]. All rights reserved. The codes documented in this report are preliminary and upon coder review may  be revised to meet current compliance requirements. Cristopher Estimable. Rourk, MD Norvel Richards, MD 08/31/2017 3:24:00 PM This report has been signed electronically. Number of Addenda: 0

## 2017-08-31 NOTE — Progress Notes (Signed)
PROGRESS NOTE    Cheyenne Hicks  ONG:[id number] DOB: 1951-02-17 DOA: 08/28/2017 PCP: [name], MD   Brief Narrative:   Cheyenne Hicks is a 67 y.o. female with medical history significant for CAD with prior stent on Plavix and aspirin, cirrhosis with portal hypertension, GERD, and dyslipidemia, who woke up this morning feeling quite weak with some transient blurry vision as well as confusion, dizziness, and lightheadedness.  She went to the urgent care facility and was told that her hemoglobin levels were very low and was told to come to the ED for further evaluation. She was admitted with acute symptomatic blood loss anemia and has been transfused 2U PRBCs with improvement in her H/H levels    Assessment & Plan:   Principal Problem:   Acute blood loss anemia Active Problems:   Cirrhosis (HCC)   GERD (gastroesophageal reflux disease)   CAD (coronary artery disease)   Portal hypertension (HCC)   Diabetes (HCC)   GI bleed   Occult GI bleeding   Symptomatic anemia   1. Acute symptomatic blood loss anemia-s/p PRBC transfusion.  Suspect secondary to GI bleed with recent history of hematemesis and dark tarry stools along with finding of positive FOBT.  Patient without further bleeding. Hgb 7.3. Grade 1 esophageal varices and AVM's appreciated. No active bleeding seen. Will start nadolol for bleeding prevention on esophageal varices, continue PPI. No NSAID's. Ok to resume plavix in 7 days; baby aspirin can be resume now.  2. GERD. continue PPI orally once a day.  3. CAD.  patient denies CP. Ok to resume baby aspirin. plavix to be resume in 7 days.  4. Type 2 diabetes. continue holding metformin and half the dose of Lantus with SSI.  follow CBG's and adjust regimen as needed  5. HTN. BP is stable. Will add low dose nadolol  6. Cirrhosis with portal hypertension. appears stable. Continue outpatient follow up with GI. 7. Thrombocytopenia.  Likely secondary to above. Will monitor  trend.    DVT prophylaxis:SCDs Code Status: Full Family Communication: None Disposition Plan: s/p EGD, well tolerated. Ok to advance diet. Repeat CBC in am.   Consultants:   GI  Procedures:   EGD  Antimicrobials:   None   Subjective: No CP, no nausea, no vomiting, no abd pain. Reports no further bleeding and expressed been hungry.   Objective: Vitals:   08/31/17 1514 08/31/17 1530 08/31/17 2058 08/31/17 2204  BP: 111/60 125/64  (!) 115/54  Pulse: 88 87  94  Resp: 17 20  20   Temp: 98 F (36.7 C)   98.6 F (37 C)  TempSrc:    Oral  SpO2: 96% 96% 93% 100%  Weight:      Height:        Intake/Output Summary (Last 24 hours) at 08/31/2017 2247 Last data filed at 08/31/2017 1700 Gross per 24 hour  Intake 1083 ml  Output 300 ml  Net 783 ml   Filed Weights   08/28/17 1425 08/28/17 2000 08/31/17 1244  Weight: 87.1 kg (192 lb) 90 kg (198 lb 6.4 oz) 89.8 kg (198 lb)    Examination: General exam: Alert, awake, oriented x 3; complaining of being hungry. No further bleeding appreciated. Respiratory system: Clear to auscultation. Respiratory effort normal. Cardiovascular system:RRR. No murmurs, rubs, gallops. Gastrointestinal system: Abdomen is nondistended, soft and nontender. No organomegaly or masses felt. Normal bowel sounds heard. Central nervous system: Alert and oriented. No focal neurological deficits. Extremities: No C/C/E, +pedal pulses Skin: No rashes, lesions  or ulcers Psychiatry: Judgement and insight appear normal. Mood & affect appropriate.    Data Reviewed: I have personally reviewed following labs and imaging studies  CBC: Recent Labs  Lab 08/28/17 1454 08/29/17 0507 08/30/17 0355 08/31/17 0442  WBC 5.3 5.9 5.4 4.8  NEUTROABS 3.6  --   --   --   HGB 5.4* 7.2* 7.1* 7.3*  HCT 19.1* 23.9* 23.9* 24.2*  MCV 77.6* 79.9 80.2 79.9  PLT 87* 89* 83* 88*   Basic Metabolic Panel: Recent Labs  Lab 08/28/17 1454 08/29/17 0507 08/30/17 0355  NA  135 138 137  K 3.8 4.0 3.9  CL 103 108 108  CO2 25 24 23   GLUCOSE 158* 133* 106*  BUN 20 18 12   CREATININE 1.15* 1.07* 1.04*  CALCIUM 8.7* 8.5* 8.3*   GFR: Estimated Creatinine Clearance: 57.4 mL/min (A) (by C-G formula based on SCr of 1.04 mg/dL (H)).   Liver Function Tests: Recent Labs  Lab 08/29/17 0507  AST 38  ALT 21  ALKPHOS 120  BILITOT 2.6*  PROT 6.4*  ALBUMIN 2.9*   Coagulation Profile: Recent Labs  Lab 08/29/17 0834  INR 1.26   CBG: Recent Labs  Lab 08/31/17 0737 08/31/17 1120 08/31/17 1235 08/31/17 1645 08/31/17 2239  GLUCAP 113* 120* 119* 154* 163*    Scheduled Meds: . insulin aspart  0-15 Units Subcutaneous TID WC  . insulin aspart  0-5 Units Subcutaneous QHS  . insulin detemir  18 Units Subcutaneous QPM  . pantoprazole  40 mg Oral Daily  . sodium chloride flush  3 mL Intravenous Q12H   Continuous Infusions: . sodium chloride       LOS: 3 days    Time spent: 30 minutes    Barton Dubois, MD [facility] Pager [phone]  If 7PM-7AM, please contact night-coverage [email or web address] Password [facility] 08/31/2017, 10:47 PM

## 2017-08-31 NOTE — Transfer of Care (Signed)
Immediate Anesthesia Transfer of Care Note  Patient: Cheyenne Hicks  Procedure(s) Performed: ESOPHAGOGASTRODUODENOSCOPY (EGD) WITH PROPOFOL (N/A ) BIOPSY  Patient Location: PACU  Anesthesia Type:MAC  Level of Consciousness: awake and alert   Airway & Oxygen Therapy: Patient Spontanous Breathing  Post-op Assessment: Report given to RN  Post vital signs: Reviewed and stable  Last Vitals:  Vitals Value Taken Time  BP 113/60 08/31/2017  3:15 PM  Temp 36.7 C 08/31/2017  3:14 PM  Pulse 52 08/31/2017  3:16 PM  Resp 22 08/31/2017  3:16 PM  SpO2 81 % 08/31/2017  3:16 PM  Vitals shown include unvalidated device data.  Last Pain:  Vitals:   08/31/17 1514  TempSrc:   PainSc: (P) 0-No pain      Patients Stated Pain Goal: 0 (42/70/62 3762)  Complications: No apparent anesthesia complications

## 2017-08-31 NOTE — Anesthesia Postprocedure Evaluation (Signed)
Anesthesia Post Note  Patient: Cheyenne Hicks  Procedure(s) Performed: ESOPHAGOGASTRODUODENOSCOPY (EGD) WITH PROPOFOL (N/A ) BIOPSY  Patient location during evaluation: PACU Anesthesia Type: MAC Level of consciousness: awake and alert and oriented Pain management: pain level controlled Vital Signs Assessment: post-procedure vital signs reviewed and stable Respiratory status: spontaneous breathing Cardiovascular status: blood pressure returned to baseline Postop Assessment: no apparent nausea or vomiting Anesthetic complications: no     Last Vitals:  Vitals:   08/31/17 1244 08/31/17 1514  BP: 125/65 111/60  Pulse: 86 88  Resp: 16 17  Temp: 36.7 C 36.7 C  SpO2: 100% 96%    Last Pain:  Vitals:   08/31/17 1514  TempSrc:   PainSc: (P) 0-No pain                 Yosiah Jasmin

## 2017-09-01 LAB — CBC
HCT: 26.1 % — ABNORMAL LOW (ref 36.0–46.0)
Hemoglobin: 7.7 g/dL — ABNORMAL LOW (ref 12.0–15.0)
MCH: 23.8 pg — ABNORMAL LOW (ref 26.0–34.0)
MCHC: 29.5 g/dL — ABNORMAL LOW (ref 30.0–36.0)
MCV: 80.6 fL (ref 78.0–100.0)
PLATELETS: 72 10*3/uL — AB (ref 150–400)
RBC: 3.24 MIL/uL — AB (ref 3.87–5.11)
RDW: 19.8 % — AB (ref 11.5–15.5)
WBC: 5.1 10*3/uL (ref 4.0–10.5)

## 2017-09-01 LAB — GLUCOSE, CAPILLARY
GLUCOSE-CAPILLARY: 221 mg/dL — AB (ref 70–99)
Glucose-Capillary: 152 mg/dL — ABNORMAL HIGH (ref 70–99)
Glucose-Capillary: 157 mg/dL — ABNORMAL HIGH (ref 70–99)

## 2017-09-01 MED ORDER — NADOLOL 40 MG PO TABS: 40.0000 mg | ORAL_TABLET | Freq: Every day | ORAL | 1 refills | Status: DC

## 2017-09-01 MED ORDER — CLOPIDOGREL BISULFATE 75 MG PO TABS: 75.0000 mg | ORAL_TABLET | Freq: Every morning | ORAL | Status: DC

## 2017-09-01 MED ORDER — PANTOPRAZOLE SODIUM 40 MG PO TBEC: 40.0000 mg | DELAYED_RELEASE_TABLET | Freq: Every day | ORAL | 1 refills | Status: AC

## 2017-09-01 NOTE — Discharge Summary (Signed)
Physician Discharge Summary  Cheyenne Hicks RWE:[id number] DOB: 04/10/1950 DOA: 08/28/2017  PCP: Cheyenne Flood, MD  Admit date: 08/28/2017 Discharge date: 09/01/2017  Time spent: 35 minutes  Recommendations for Outpatient Follow-up:  1. Repeat CBC to follow hemoglobin and platelets trend 2. -Repeat basic metabolic panel to follow electrolytes and renal function 3. Reassess blood pressure and further adjust antihypertensive regimen as needed.   Discharge Diagnoses:  Principal Problem:   Acute blood loss anemia Active Problems:   Cirrhosis (HCC)   GERD (gastroesophageal reflux disease)   CAD (coronary artery disease)   Portal hypertension (HCC)   Diabetes (HCC)   GI bleed   Occult GI bleeding   Symptomatic anemia   Discharge Condition: Stable and improved.  No further signs of active bleeding.  Patient advised to follow-up with PCP in 10 days and to follow-up with gastroenterology service in 4-6 weeks.  Diet recommendation: Heart healthy diet and modified carbohydrates.  Filed Weights   08/28/17 1425 08/28/17 2000 08/31/17 1244  Weight: 87.1 kg (192 lb) 90 kg (198 lb 6.4 oz) 89.8 kg (198 lb)    History of present illness:  As per H&P written by Dr. Manuella Hicks on 7/80/19. 67 y.o. female with medical history significant for CAD with prior stent on Plavix and aspirin, cirrhosis with portal hypertension, GERD, and dyslipidemia, who woke up this morning feeling quite weak with some transient blurry vision as well as confusion, dizziness, and lightheadedness.  She went to the urgent care facility and was told that her hemoglobin levels were very low and was told to come to the ED for further evaluation.  She states that approximately 1 week ago she had some very dark emesis with some blood as well as dark, tarry stools.  She states that this began shortly after taking iron supplementation that was prescribed by her PCP with a diagnosis of iron deficiency anemia.  She has since stopped this  medication with some improvement in her symptoms, but has maintained some nausea with poor appetite and has not had a bowel movement in the last 2 to 3 days. She denies any abdominal pain, fever, chills, chest pain, or shortness of breath.    Hospital Course:   1. Acute symptomatic blood loss anemia-s/p PRBC transfusion. Suspect secondary to GI bleed with recent history of hematemesis and dark tarry stools along with finding of positive FOBT. Patient without further bleeding. Hgb 7.7. Grade 1 esophageal varices and AVM's appreciated. No active bleeding seen. Will start nadolol for bleeding prevention on esophageal varices, continue PPI. No NSAID's. Ok to resume plavix in 7 days; baby aspirin can be resume now.  2. GERD. continue PPI orally once a day. Outpatient follow up with GI service.   3. CAD. patient denies CP. Ok to resume baby aspirin. plavix to be resume in 7 days.  4. Type 2 diabetes. resume home hypoglycemic regimen. Patient advised to follow modified carb diet.  5. HTN. BP is stable. Will add low dose nadolol and stop Coreg. Resume rest of antihypertensive agents. Advise to follow low sodium diet.  6. Cirrhosis with portal hypertension. appears stable. Continue outpatient follow up with GI. 7. Thrombocytopenia. Likely secondary to above. Will recommend CBC at follow up visit to monitor trend. 8. 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 Discussed with Dr. Megan Salon, no further work up or testing needed.    Procedures:  EGD  Consultations:  Gastroenterology service.  Discharge Exam: Vitals:   09/01/17 0542  09/01/17 1231  BP: (!) 104/47 (!) 127/54  Pulse: 91 88  Resp: 15   Temp: 98.8 F (37.1 C)   SpO2: 100%     General exam: Alert, awake, oriented x 3; in no acute distress, denies chest pain, no shortness of breath, no nausea, no vomiting, no further bleeding appreciated.   Respiratory system: Clear to auscultation.  Respiratory effort normal. Cardiovascular system:RRR. No murmurs, rubs, gallops. Gastrointestinal system: Abdomen is nondistended, soft and nontender. No organomegaly or masses felt. Normal bowel sounds heard. Central nervous system: Alert and oriented. No focal neurological deficits. Extremities: No C/C/E, +pedal pulses Skin: No rashes, lesions or ulcers Psychiatry: Judgement and insight appear normal. Mood & affect appropriate.    Discharge Instructions   Discharge Instructions    Diet - low sodium heart healthy   Complete by:  As directed    Discharge instructions   Complete by:  As directed    Keep yourself well hydrated Follow up with PCP in 10 days Take medications as prescribed  Avoid the use of NSAID's     Allergies as of 09/01/2017      Reactions   Iodinated Diagnostic Agents Hives   Reports severe hives and syncope after heart cath   Metoprolol Itching      Medication List    STOP taking these medications   carvedilol 6.25 MG tablet Commonly known as:  COREG     TAKE these medications   aspirin 81 MG tablet Take 81 mg by mouth daily.   clopidogrel 75 MG tablet Commonly known as:  PLAVIX Take 1 tablet (75 mg total) by mouth every morning. Start taking on:  09/08/2017 What changed:  These instructions start on 09/08/2017. If you are unsure what to do until then, ask your doctor or other care provider.   ferrous gluconate 324 MG tablet Commonly known as:  FERGON Take 324 mg by mouth every evening.   furosemide 40 MG tablet Commonly known as:  LASIX Take 40 mg by mouth every morning.   insulin detemir 100 UNIT/ML injection Commonly known as:  LEVEMIR Inject 36 Units into the skin every evening.   lisinopril 10 MG tablet Commonly known as:  PRINIVIL,ZESTRIL Take 10 mg by mouth every morning.   metFORMIN 500 MG tablet Commonly known as:  GLUCOPHAGE Take 500 mg by mouth 2 (two) times daily with a meal.   nadolol 40 MG tablet Commonly known as:   CORGARD Take 1 tablet (40 mg total) by mouth daily. Start taking on:  09/02/2017   pantoprazole 40 MG tablet Commonly known as:  PROTONIX Take 1 tablet (40 mg total) by mouth daily. Start taking on:  09/02/2017   ranitidine 150 MG tablet Commonly known as:  ZANTAC Take 150 mg by mouth at bedtime.   simvastatin 40 MG tablet Commonly known as:  ZOCOR Take 40 mg by mouth daily at 6 PM.   vitamin C 500 MG tablet Commonly known as:  ASCORBIC ACID Take 500 mg by mouth every evening.      Allergies  Allergen Reactions  . Iodinated Diagnostic Agents Hives    Reports severe hives and syncope after heart cath  . Metoprolol Itching   Follow-up Information    Cheyenne Flood, MD. Schedule an appointment as soon as possible for a visit in 10 day(s).   Specialty:  Family Medicine Contact information: [address] [place] [address] [place] [address] [phone]           The results  of significant diagnostics from this hospitalization (including imaging, microbiology, ancillary and laboratory) are listed below for reference.    Labs: Basic Metabolic Panel: Recent Labs  Lab 08/28/17 1454 08/29/17 0507 08/30/17 0355  NA 135 138 137  K 3.8 4.0 3.9  CL 103 108 108  CO2 25 24 23   GLUCOSE 158* 133* 106*  BUN 20 18 12   CREATININE 1.15* 1.07* 1.04*  CALCIUM 8.7* 8.5* 8.3*   Liver Function Tests: Recent Labs  Lab 08/29/17 0507  AST 38  ALT 21  ALKPHOS 120  BILITOT 2.6*  PROT 6.4*  ALBUMIN 2.9*   CBC: Recent Labs  Lab 08/28/17 1454 08/29/17 0507 08/30/17 0355 08/31/17 0442 09/01/17 0856  WBC 5.3 5.9 5.4 4.8 5.1  NEUTROABS 3.6  --   --   --   --   HGB 5.4* 7.2* 7.1* 7.3* 7.7*  HCT 19.1* 23.9* 23.9* 24.2* 26.1*  MCV 77.6* 79.9 80.2 79.9 80.6  PLT 87* 89* 83* 88* 72*    CBG: Recent Labs  Lab 08/31/17 1645 08/31/17 2239 09/01/17 0540 09/01/17 0804 09/01/17 1202  GLUCAP 154* 163* 152* 157* 221*    Signed:  Barton Dubois MD.  [facility] 09/01/2017, 3:32 PM

## 2017-09-01 NOTE — Plan of Care (Signed)
  Problem: Education: Goal: Knowledge of General Education information will improve Outcome: Adequate for Discharge   Problem: Health Behavior/Discharge Planning: Goal: Ability to manage health-related needs will improve Outcome: Adequate for Discharge   Problem: Clinical Measurements: Goal: Ability to maintain clinical measurements within normal limits will improve Outcome: Adequate for Discharge Goal: Will remain free from infection Outcome: Adequate for Discharge Goal: Diagnostic test results will improve Outcome: Adequate for Discharge Goal: Cardiovascular complication will be avoided Outcome: Adequate for Discharge   Problem: Activity: Goal: Risk for activity intolerance will decrease Outcome: Adequate for Discharge   Problem: Nutrition: Goal: Adequate nutrition will be maintained Outcome: Adequate for Discharge   Problem: Coping: Goal: Level of anxiety will decrease Outcome: Adequate for Discharge   Problem: Elimination: Goal: Will not experience complications related to bowel motility Outcome: Adequate for Discharge Goal: Will not experience complications related to urinary retention Outcome: Adequate for Discharge   Problem: Pain Managment: Goal: General experience of comfort will improve Outcome: Adequate for Discharge   Problem: Safety: Goal: Ability to remain free from injury will improve Outcome: Adequate for Discharge   Problem: Skin Integrity: Goal: Risk for impaired skin integrity will decrease Outcome: Adequate for Discharge

## 2017-09-01 NOTE — Addendum Note (Signed)
Addendum  created 09/01/17 1409 by Ollen Bowl, CRNA   Sign clinical note

## 2017-09-01 NOTE — Progress Notes (Signed)
Pt's IV catheter removed and intact. Pt's IV site clean dry and intact. Discharge instructions including medications and follow up appointments were reviewed and discussed with patient. All questions were answered and no further questions at this time. Pt in stable condition and in no acute distress at time of discharge. Pt will be escorted by nurse tech.  

## 2017-09-01 NOTE — Progress Notes (Addendum)
Subjective: Feels well, no complaints. Denies abdominal pain, N/V, hematochezia or melena (had a "normal" stool last night). Tolerating a diet well. No other concerns.  Objective: Vital signs in last 24 hours: Temp:  [98 F (36.7 C)-98.8 F (37.1 C)] 98.8 F (37.1 C) (07/12 0542) Pulse Rate:  [86-94] 91 (07/12 0542) Resp:  [15-20] 15 (07/12 0542) BP: (104-125)/(47-65) 104/47 (07/12 0542) SpO2:  [93 %-100 %] 100 % (07/12 0542) Weight:  [198 lb (89.8 kg)] 198 lb (89.8 kg) (07/11 1244) Last BM Date: 08/29/17 General:   Alert and oriented, pleasant Head:  Normocephalic and atraumatic. Eyes:  No icterus, sclera clear. Conjuctiva pink.  Heart:  S1, S2 present, no murmurs noted.  Lungs: Clear to auscultation bilaterally, without wheezing, rales, or rhonchi.  Abdomen:  Bowel sounds present, soft, non-tender, non-distended. No HSM or hernias noted. No rebound or guarding. No masses appreciated  Msk:  Symmetrical without gross deformities. Pulses:  Normal bilateral DP pulses noted. Extremities:  Without clubbing or edema. Neurologic:  Alert and  oriented x4;  grossly normal neurologically. Skin:  Warm and dry, intact without significant lesions.  Cervical Nodes:  No significant cervical adenopathy. Psych:  Alert and cooperative. Normal mood and affect.  Intake/Output from previous day: 07/11 0701 - 07/12 0700 In: 1080 [P.O.:530; I.V.:550] Out: 0  Intake/Output this shift: No intake/output data recorded.  Lab Results: Recent Labs    08/30/17 0355 08/31/17 0442  WBC 5.4 4.8  HGB 7.1* 7.3*  HCT 23.9* 24.2*  PLT 83* 88*   BMET Recent Labs    08/30/17 0355  NA 137  K 3.9  CL 108  CO2 23  GLUCOSE 106*  BUN 12  CREATININE 1.04*  CALCIUM 8.3*   LFT No results for input(s): PROT, ALBUMIN, AST, ALT, ALKPHOS, BILITOT, BILIDIR, IBILI in the last 72 hours. PT/INR No results for input(s): LABPROT, INR in the last 72 hours. Hepatitis Panel No results for input(s):  HEPBSAG, HCVAB, HEPAIGM, HEPBIGM in the last 72 hours.   Studies/Results: No results found.  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 Endorses use of Aleve and Ibuprofen intermittently, and it is unclear if she has been on a PPI long-term. Recently told she had iron deficiency anemia as an outpatient and started on oral iron, with subsequent black stool, N/V. Unclear if she has had true melena this admission, but last evidence of black stool was yesterday evening per patient. No hematemesis or frank bleeding. Hemodynamically stable and does not appear to have acute variceal bleeding; in this scenario would favor NSAID-related etiology such as PUD. No prior EGD or colonoscopy; unknown if any varices present. Hgb improved s/p 2 units PRBCs.   Her hgb has been stable over the last couple days (7.3 yesterday, today's value pending). She underwent EGD yesterday which found Grade I varices, portal gastropathy, gastric AVMs s/p ablation, and scattered erosions. Recommended discuss with cardiology starting non-selective beta blocker (Nadolol vs Propranolol) versus repeat EGD 18 months. Protonix daily. Can continue 81 mg ASA, but hold Plavix an additional 7 days; absolute avoidance of all other NSAIDs.  Absolute avoidance of NSAIDs (other than ASA 81 mg) in future was discussed with the patient. 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 Question sidebranch IPMN on  imaging and no further surveillance completed.  Today she is clinically improved and wanting to go home soon. CBC today pending. Had a bowel movement last night which she states was brown (and "not black like it had been"). No anemia s/s.  Plan: 1. Follow CBC result 2. Hold Plavix as  recommended in EGD report (restart next Friday) 3. Monitor for any further s/s of GI bleed 4. Non-selective beta blocker (Nadolol 40 mg daily) if ok with cardiology; titrate to HR 55-60 5. Repeat EGD 18 months if unable to initiate beta blockade variceal prophylaxis 6. Anticipate d/c in the next 24 hours from GI perspective 7. Outpatient GI follow-up 4-6 weeks 8. Supportive measures   Thank you for allowing [place] to participate in the care of [place], DNP, AGNP-C Adult & Gerontological Nurse Practitioner [facility]   ADDENDUM: Hgb increased today from 7.3 (yesterday) to 7.7. No further recommendations.     LOS: 4 days    09/01/2017, 9:10 AM

## 2017-09-01 NOTE — Care Management Important Message (Signed)
Important Message  Patient Details  Name: Cheyenne Hicks MRN: [id number] Date of Birth: November 21, 1950   Medicare Important Message Given:  Yes    Shelda Altes 09/01/2017, 12:21 PM

## 2017-09-01 NOTE — Anesthesia Postprocedure Evaluation (Signed)
Anesthesia Post Note  Patient: Cheyenne Hicks  Procedure(s) Performed: ESOPHAGOGASTRODUODENOSCOPY (EGD) WITH PROPOFOL (N/A ) BIOPSY  Patient location during evaluation: Nursing Unit Anesthesia Type: MAC Level of consciousness: awake and alert and oriented Pain management: pain level controlled Vital Signs Assessment: post-procedure vital signs reviewed and stable Respiratory status: spontaneous breathing Cardiovascular status: blood pressure returned to baseline and stable Postop Assessment: no apparent nausea or vomiting Anesthetic complications: no     Last Vitals:  Vitals:   09/01/17 0542 09/01/17 1231  BP: (!) 104/47 (!) 127/54  Pulse: 91 88  Resp: 15   Temp: 37.1 C   SpO2: 100%     Last Pain:  Vitals:   09/01/17 0936  TempSrc:   PainSc: 0-No pain                 Klay Sobotka

## 2017-09-04 ENCOUNTER — Encounter (HOSPITAL_COMMUNITY): Payer: Self-pay | Admitting: Internal Medicine

## 2017-09-05 ENCOUNTER — Other Ambulatory Visit (HOSPITAL_COMMUNITY): Payer: Self-pay | Admitting: Family Medicine

## 2017-09-05 DIAGNOSIS — K7460 Unspecified cirrhosis of liver: Secondary | ICD-10-CM

## 2017-09-08 ENCOUNTER — Encounter: Payer: Self-pay | Admitting: Internal Medicine

## 2017-09-12 ENCOUNTER — Ambulatory Visit (HOSPITAL_COMMUNITY)
Admission: RE | Admit: 2017-09-12 | Discharge: 2017-09-12 | Disposition: A | Discharge status: Home / Self Care | Payer: Medicare HMO | Source: Ambulatory Visit | Attending: Family Medicine | Admitting: Family Medicine

## 2017-09-12 ENCOUNTER — Encounter: Payer: Self-pay | Admitting: Internal Medicine

## 2017-09-12 DIAGNOSIS — R161 Splenomegaly, not elsewhere classified: Secondary | ICD-10-CM | POA: Insufficient documentation

## 2017-09-12 DIAGNOSIS — K7460 Unspecified cirrhosis of liver: Secondary | ICD-10-CM | POA: Insufficient documentation

## 2017-10-26 ENCOUNTER — Other Ambulatory Visit (HOSPITAL_COMMUNITY): Payer: Self-pay | Admitting: Family Medicine

## 2017-10-26 DIAGNOSIS — Z1231 Encounter for screening mammogram for malignant neoplasm of breast: Secondary | ICD-10-CM

## 2017-11-08 ENCOUNTER — Encounter: Payer: Self-pay | Admitting: Nurse Practitioner

## 2017-11-08 ENCOUNTER — Ambulatory Visit (INDEPENDENT_AMBULATORY_CARE_PROVIDER_SITE_OTHER): Payer: Medicare HMO | Admitting: Nurse Practitioner

## 2017-11-08 VITALS — BP 116/66 | HR 87 | Temp 97.0°F | Ht 63.0 in | Wt 199.8 lb

## 2017-11-08 DIAGNOSIS — R112 Nausea with vomiting, unspecified: Secondary | ICD-10-CM

## 2017-11-08 DIAGNOSIS — R1084 Generalized abdominal pain: Secondary | ICD-10-CM | POA: Diagnosis not present

## 2017-11-08 DIAGNOSIS — K760 Fatty (change of) liver, not elsewhere classified: Secondary | ICD-10-CM

## 2017-11-08 DIAGNOSIS — K7460 Unspecified cirrhosis of liver: Secondary | ICD-10-CM

## 2017-11-08 DIAGNOSIS — K922 Gastrointestinal hemorrhage, unspecified: Secondary | ICD-10-CM

## 2017-11-08 DIAGNOSIS — R140 Abdominal distension (gaseous): Secondary | ICD-10-CM

## 2017-11-08 DIAGNOSIS — R109 Unspecified abdominal pain: Secondary | ICD-10-CM | POA: Insufficient documentation

## 2017-11-08 NOTE — Patient Instructions (Addendum)
No PA needed for GES per [employer].

## 2017-11-08 NOTE — Progress Notes (Signed)
Referring Provider: [name], MD Primary Care Physician:  Alfonse Flavors, MD Primary GI:  Dr. Oneida Alar  Chief Complaint  Patient presents with  . Cirrhosis    f/u.  Marland Kitchen Abdominal Pain    "was really bad last night"  . Bloated    HPI:   Cheyenne Hicks is a 67 y.o. female who presents for hospital follow-up.  The patient was admitted from 08/28/2017 through 09/01/2017 for acute blood loss anemia.  She presented to the emergency department on Plavix and aspirin with a history of cirrhosis and portal hypertension, GERD. She sought care due to waking up feeling weak and transient blurry vision as well as confusion, dizziness, lightheadedness.  Urgent care told her her hemoglobin was very low and she needed to proceed to the ED.  She did have some dark emesis with blood and dark tarry stools a week prior but had recently started iron supplementation by primary care for iron deficiency anemia.  She did have some nausea as well.  She had a positive FOBT and hemoglobin noted to be 7.7.  EGD completed which found grade 1 esophageal varices and AVMs with no active bleeding.  Recommended nadolol for bleeding prevention of varices and continue PPI.  No NSAIDs.  Resume Plavix in 7 days.  Noted outpatient follow-up with GI.  Today she states she's not doing well. She had significant abdominal pain, bloating, nausea and eventually vomiting last night. This was post-prandial. No hematemesis. Felt like her food wasn't digesting. She has had some intermittent diarrhea but responds well to Imodium. Denies hematochezia, melena, fever, chills.  She feels she's lost some weight recently due to decreased appetite from the symptoms above. Overall, she feels like her food is "stuck in my stomach" and not digesting right. Denies yellowing of eyes, darkened urine, acute episodic confusion, tremors/shakes, generalized pruritis. Was having bilateral LE edema, but PCP adjusted diuretic dose and this has helped  significantly. Denies chest pain, dyspnea, dizziness, lightheadedness, syncope, near syncope. Denies any other upper or lower GI symptoms.  Her cardiologist stopped her Plavix.  Past Medical History:  Diagnosis Date  . CHF (congestive heart failure) ([place])   . Cirrhosis ([place])   . Diabetes mellitus without complication ([place])   . GERD (gastroesophageal reflux disease)   . Hyperlipidemia   . Hypertension   . NSTEMI (non-ST elevated myocardial infarction) ([place])    11/2014  . Portal hypertension ([facility])     Past Surgical History:  Procedure Laterality Date  . BIOPSY  08/31/2017   Procedure: BIOPSY;  Surgeon: Daneil Dolin, MD;  Location: [facility];  Service: Gastroenterology;;  gastric biopsy  . CORONARY ANGIOPLASTY WITH STENT PLACEMENT  05/2015   DES  . ESOPHAGOGASTRODUODENOSCOPY (EGD) WITH PROPOFOL N/A 08/31/2017   Procedure: ESOPHAGOGASTRODUODENOSCOPY (EGD) WITH PROPOFOL;  Surgeon: Daneil Dolin, MD;  Location: [facility];  Service: Gastroenterology;  Laterality: N/A;  . none    . VALVE REPLACEMENT  05/2015   transcatheter aortic valve replacement at [facility]    Current Outpatient Medications  Medication Sig Dispense Refill  . aspirin 81 MG tablet Take 81 mg by mouth daily.    . ferrous gluconate (FERGON) 324 MG tablet Take 324 mg by mouth every evening.   5  . furosemide (LASIX) 80 MG tablet Take 80 mg by mouth every morning.     . hydrOXYzine (ATARAX/VISTARIL) 25 MG tablet Take 25 mg by mouth 3 (three) times daily.    . insulin  detemir (LEVEMIR) 100 UNIT/ML injection Inject 36 Units into the skin every evening.    Marland Kitchen lisinopril (PRINIVIL,ZESTRIL) 2.5 MG tablet Take 2.5 mg by mouth every morning.     . metFORMIN (GLUCOPHAGE) 500 MG tablet Take 500 mg by mouth 2 (two) times daily with a meal.     . nadolol (CORGARD) 40 MG tablet Take 1 tablet (40 mg total) by mouth daily. 30 tablet 1  . pantoprazole (PROTONIX) 40 MG tablet Take 1 tablet (40 mg total) by mouth daily. 30  tablet 1  . simvastatin (ZOCOR) 40 MG tablet Take 40 mg by mouth daily at 6 PM.    . vitamin C (ASCORBIC ACID) 500 MG tablet Take 500 mg by mouth every evening.    . ranitidine (ZANTAC) 150 MG tablet Take 150 mg by mouth at bedtime.     No current facility-administered medications for this visit.     Allergies as of 11/08/2017 - Review Complete 11/08/2017  Allergen Reaction Noted  . Iodinated diagnostic agents Hives 04/08/2015  . Metoprolol Itching 11/25/2016    Family History  Problem Relation Age of Onset  . Heart attack Mother   . Liver disease Other        patient not aware of any  . Heart attack Sister        died age 52  . Colon cancer Neg Hx     Social History   Socioeconomic History  . Marital status: Unknown    Spouse name: Not on file  . Number of children: 1  . Years of education: Not on file  . Highest education level: Not on file  Occupational History  . Occupation: work at [employer]  . Financial resource strain: Not on file  . Food insecurity:    Worry: Not on file    Inability: Not on file  . Transportation needs:    Medical: Not on file    Non-medical: Not on file  Tobacco Use  . Smoking status: Current Some Day Smoker    Packs/day: 0.25    Types: Cigarettes  . Smokeless tobacco: Never Used  Substance and Sexual Activity  . Alcohol use: Not Currently    Alcohol/week: 0.0 standard drinks    Comment: pint in 24 hours once-twice per week (for many years), stopped 3 years ago  . Drug use: No  . Sexual activity: Not on file  Lifestyle  . Physical activity:    Days per week: Not on file    Minutes per session: Not on file  . Stress: Not on file  Relationships  . Social connections:    Talks on phone: Not on file    Gets together: Not on file    Attends religious service: Not on file    Active member of club or organization: Not on file    Attends meetings of clubs or organizations: Not on file    Relationship status: Not on  file  Other Topics Concern  . Not on file  Social History Narrative  . Not on file    Review of Systems: General: Negative for anorexia, weight loss, fever, chills, fatigue, weakness. Eyes: Negative for vision changes.  ENT: Negative for hoarseness, difficulty swallowing , nasal congestion. CV: Negative for chest pain, angina, palpitations, dyspnea on exertion, peripheral edema.  Respiratory: Negative for dyspnea at rest, dyspnea on exertion, cough, sputum, wheezing.  GI: See history of present illness. GU:  Negative for dysuria, hematuria, urinary incontinence, urinary frequency,  nocturnal urination.  MS: Negative for joint pain, low back pain.  Derm: Negative for rash or itching.  Neuro: Negative for weakness, abnormal sensation, seizure, frequent headaches, memory loss, confusion.  Psych: Negative for anxiety, depression, suicidal ideation, hallucinations.  Endo: Negative for unusual weight change.  Heme: Negative for bruising or bleeding. Allergy: Negative for rash or hives.   Physical Exam: BP 116/66   Pulse 87   Temp (!) 97 F (36.1 C) (Oral)   Ht 5' 3"  (1.6 m)   Wt 199 lb 12.8 oz (90.6 kg)   BMI 35.39 kg/m  General:   Alert and oriented. Pleasant and cooperative. Well-nourished and well-developed.  Eyes:  Without icterus, sclera clear and conjunctiva pink.  Ears:  Normal auditory acuity. Cardiovascular:  S1, S2 present without murmurs appreciated. Extremities without clubbing or edema. Respiratory:  Clear to auscultation bilaterally. No wheezes, rales, or rhonchi. No distress.  Gastrointestinal:  +BS, soft, non-tender and non-distended. No HSM noted. No guarding or rebound. No masses appreciated.  Rectal:  Deferred  Musculoskalatal:  Symmetrical without gross deformities. Skin:  Intact without significant lesions or rashes. Neurologic:  Alert and oriented x4;  grossly normal neurologically. Psych:  Alert and cooperative. Normal mood and affect. Heme/Lymph/Immune: No  excessive bruising noted.    11/08/2017 10:23 AM   Disclaimer: This note was dictated with voice recognition software. Similar sounding words can inadvertently be transcribed and may not be corrected upon review.

## 2017-11-08 NOTE — Patient Instructions (Addendum)
1. Have your labs drawn when you are able to. 2. We will help schedule your stomach emptying test. 3. I am providing further information below related to diet changes to help with your stomach emptying. 4. Return for follow-up in 4 months. 5. Call us if you have any questions or concerns.  At [facility] we value your feedback. You may receive a survey about your visit today. Please share your experience as we strive to create trusting relationships with our patients to provide genuine, compassionate, quality care.  We appreciate your understanding and patience as we review any laboratory studies, imaging, and other diagnostic tests that are ordered as we care for you. Our office policy is 5 business days for review of these results, and any emergent or urgent results are addressed in a timely manner for your best interest. If you do not hear from our office in 1 week, please contact us.   We also encourage the use of MyChart, which contains your medical information for your review as well. If you are not enrolled in this feature, an access code is on this after visit summary for your convenience. Thank you for allowing [place] to be involved in your care.  It was great to see you today!  I hope you have a great Fall!!      Gastroparesis Gastroparesis, also called delayed gastric emptying, is a condition in which food takes longer than normal to empty from the stomach. The condition is usually long-lasting (chronic). What are the causes? This condition may be caused by:  An endocrine disorder, such as hypothyroidism or diabetes. Diabetes is the most common cause of this condition.  A nervous system disease, such as Parkinson disease or multiple sclerosis.  Cancer, infection, or surgery of the stomach or vagus nerve.  A connective tissue disorder, such as scleroderma.  Certain medicines.  In most cases, the cause is not known. What increases the risk? This condition is  more likely to develop in:  People with certain disorders, including endocrine disorders, eating disorders, amyloidosis, and scleroderma.  People with certain diseases, including Parkinson disease or multiple sclerosis.  People with cancer or infection of the stomach or vagus nerve.  People who have had surgery on the stomach or vagus nerve.  People who take certain medicines.  Women.  What are the signs or symptoms? Symptoms of this condition include:  An early feeling of fullness when eating.  Nausea.  Weight loss.  Vomiting.  Heartburn.  Abdominal bloating.  Inconsistent blood glucose levels.  Lack of appetite.  Acid from the stomach coming up into the esophagus (gastroesophageal reflux).  Spasms of the stomach.  Symptoms may come and go. How is this diagnosed? This condition is diagnosed with tests, such as:  Tests that check how long it takes food to move through the stomach and intestines. These tests include: ? Upper gastrointestinal (GI) series. In this test, X-rays of the intestines are taken after you drink a liquid. The liquid makes the intestines show up better on the X-rays. ? Gastric emptying scintigraphy. In this test, scans are taken after you eat food that contains a small amount of radioactive material. ? Wireless capsule GI monitoring system. This test involves swallowing a capsule that records information about movement through the stomach.  Gastric manometry. This test measures electrical and muscular activity in the stomach. It is done with a thin tube that is passed down the throat and into the stomach.  Endoscopy. This test checks for  abnormalities in the lining of the stomach. It is done with a long, thin tube that is passed down the throat and into the stomach.  An ultrasound. This test can help rule out gallbladder disease or pancreatitis as a cause of your symptoms. It uses sound waves to take pictures of the inside of your body.  How is  this treated? There is no cure for gastroparesis. This condition may be managed with:  Treatment of the underlying condition causing the gastroparesis.  Lifestyle changes, including exercise and dietary changes. Dietary changes can include: ? Changes in what and when you eat. ? Eating smaller meals more often. ? Eating low-fat foods. ? Eating low-fiber forms of high-fiber foods, such as cooked vegetables instead of raw vegetables. ? Having liquid foods in place of solid foods. Liquid foods are easier to digest.  Medicines. These may be given to control nausea and vomiting and to stimulate stomach muscles.  Getting food through a feeding tube. This may be done in severe cases.  A gastric neurostimulator. This is a device that is inserted into the body with surgery. It helps improve stomach emptying and control nausea and vomiting.  Follow these instructions at home:  Follow your health care provider's instructions about exercise and diet.  Take medicines only as directed by your health care provider. Contact a health care provider if:  Your symptoms do not improve with treatment.  You have new symptoms. Get help right away if:  You have severe abdominal pain that does not improve with treatment.  You have nausea that does not go away.  You cannot keep fluids down. This information is not intended to replace advice given to you by your health care provider. Make sure you discuss any questions you have with your health care provider. Document Released: 02/07/2005 Document Revised: 07/16/2015 Document Reviewed: 02/03/2014 Elsevier Interactive Patient Education  [employer].

## 2017-11-10 ENCOUNTER — Telehealth: Payer: Self-pay | Admitting: Nurse Practitioner

## 2017-11-10 ENCOUNTER — Encounter (HOSPITAL_COMMUNITY): Payer: Medicare HMO

## 2017-11-10 DIAGNOSIS — K922 Gastrointestinal hemorrhage, unspecified: Secondary | ICD-10-CM

## 2017-11-10 NOTE — Assessment & Plan Note (Signed)
Noted history of cirrhosis, unknown etiology.  Acute hepatitis panel on file from [facility] which was nonreactive.  I will check for hepatitis B serologies for immune status.  I will also check autoimmune markers to help identify any possible cause for her cirrhosis.  Further recommendations to follow results.  Recommend follow-up in 4 months.  Call if any worsening symptoms before then.

## 2017-11-10 NOTE — Assessment & Plan Note (Signed)
Nausea and vomiting noted in the setting of cirrhosis with abdominal pain, bloating, sensation of fullness and early satiety as well as "feels like my food does not move through my stomach normally."  Possible delayed gastric emptying related to cirrhosis.  I will check a gastric emptying study.  Delayed gastric emptying/gastroparesis diet has been provided.  Further recommendations to follow results.  Follow-up in 4 months.

## 2017-11-10 NOTE — Assessment & Plan Note (Signed)
Abdominal pain with nausea and vomiting and overall sensation of bloating and fullness like "food is stuck in my stomach."  She does have a history of cirrhosis.  I will check a gastric emptying study and give her further information about a delayed gastric emptying diet.  Further recommendations to follow GES results.  Return for follow-up in 4 months.

## 2017-11-10 NOTE — Telephone Encounter (Signed)
Please have the patient recheck a CBC (if not already done by PCP). I will enter the order.

## 2017-11-10 NOTE — Assessment & Plan Note (Signed)
History of GI bleed in the emergency department with a low hemoglobin of 7.7.  EGD was completed which found grade 1 esophageal varices and AVMs with no active bleeding.  Recommended nadolol and no NSAIDs.  Plavix is since been discontinued by cardiology.  She notes no further GI bleed.  We will recheck her CBC.  Follow-up in 4 months.

## 2017-11-13 ENCOUNTER — Other Ambulatory Visit: Payer: Self-pay

## 2017-11-13 DIAGNOSIS — K922 Gastrointestinal hemorrhage, unspecified: Secondary | ICD-10-CM

## 2017-11-13 NOTE — Telephone Encounter (Signed)
Unable to reach pt by phone.  Mailed a letter and lab order for pt to have done if she has not already done so.

## 2017-11-13 NOTE — Progress Notes (Signed)
cc'd to pcp 

## 2017-11-14 ENCOUNTER — Ambulatory Visit: Payer: Medicare HMO | Admitting: Internal Medicine

## 2017-11-27 ENCOUNTER — Ambulatory Visit (HOSPITAL_COMMUNITY): Payer: Medicare HMO

## 2018-01-12 ENCOUNTER — Encounter (HOSPITAL_COMMUNITY)
Admission: RE | Admit: 2018-01-12 | Discharge: 2018-01-12 | Disposition: A | Discharge status: Home / Self Care | Payer: Medicare HMO | Source: Ambulatory Visit | Attending: Nurse Practitioner | Admitting: Nurse Practitioner

## 2018-01-12 ENCOUNTER — Encounter (HOSPITAL_COMMUNITY): Payer: Self-pay

## 2018-01-12 DIAGNOSIS — K760 Fatty (change of) liver, not elsewhere classified: Secondary | ICD-10-CM

## 2018-01-12 DIAGNOSIS — K922 Gastrointestinal hemorrhage, unspecified: Secondary | ICD-10-CM | POA: Diagnosis present

## 2018-01-12 DIAGNOSIS — R1084 Generalized abdominal pain: Secondary | ICD-10-CM | POA: Diagnosis present

## 2018-01-12 DIAGNOSIS — R140 Abdominal distension (gaseous): Secondary | ICD-10-CM | POA: Diagnosis present

## 2018-01-12 DIAGNOSIS — R112 Nausea with vomiting, unspecified: Secondary | ICD-10-CM | POA: Diagnosis present

## 2018-01-12 DIAGNOSIS — K7460 Unspecified cirrhosis of liver: Secondary | ICD-10-CM

## 2018-01-12 MED ORDER — TECHNETIUM TC 99M SULFUR COLLOID
2.0000 | Freq: Once | INTRAVENOUS | Status: AC | PRN
  Administered 2018-01-12: 2 via ORAL

## 2018-01-15 ENCOUNTER — Ambulatory Visit (HOSPITAL_COMMUNITY)
Admission: RE | Admit: 2018-01-15 | Discharge: 2018-01-15 | Disposition: A | Discharge status: Home / Self Care | Payer: Medicare HMO | Source: Ambulatory Visit | Attending: Nurse Practitioner | Admitting: Nurse Practitioner

## 2018-01-15 ENCOUNTER — Telehealth: Payer: Self-pay | Admitting: Gastroenterology

## 2018-01-15 DIAGNOSIS — R1030 Lower abdominal pain, unspecified: Secondary | ICD-10-CM | POA: Diagnosis not present

## 2018-01-15 DIAGNOSIS — R1900 Intra-abdominal and pelvic swelling, mass and lump, unspecified site: Secondary | ICD-10-CM | POA: Diagnosis present

## 2018-01-15 NOTE — Addendum Note (Signed)
Addended by: Gordy Levan, Evett Kassa A on: 01/15/2018 12:33 PM   Modules accepted: Orders

## 2018-01-15 NOTE — Telephone Encounter (Signed)
Patient called again asking if we heard anything from the doctor yet. I told her that DS was at lunch and will have to call her back. [phone]

## 2018-01-15 NOTE — Telephone Encounter (Signed)
See previous phone note. I have called her.

## 2018-01-15 NOTE — Telephone Encounter (Signed)
Pt came by the office and picked up the Linzess 72 mcg. She is aware of the instructions to take one capsule daily, 30 min before breakfast, but aware NOT TO TAKE UNTIL I CALL AND TELL HER OK TO START IT.

## 2018-01-15 NOTE — Telephone Encounter (Signed)
PT is aware. Not sure she can get transportation, but will let me know.

## 2018-01-15 NOTE — Telephone Encounter (Signed)
Pt called this morning to say she had a procedure on Friday and she is starting to swelling up in her abdomen and doesn't know what to do. Please advise. [phone]

## 2018-01-15 NOTE — Telephone Encounter (Signed)
PT called and should be getting a ride to [facility] this afternoon. She will call me before she goes home.

## 2018-01-15 NOTE — Telephone Encounter (Signed)
It sounds like constipation. With no abdominal pain doubt bowel obstruction or ileus. Let's check an abdominal XRay. After the XRay (she can walk in a [facility]) she can pick up sample box (x1) of Linzess to see if this helps her have a bowel movement. Keep in mind she may have some diarrhea with this.   Call her in 2 days to check on her and see how she's doing.

## 2018-01-15 NOTE — Telephone Encounter (Signed)
T/C from Osvaldo Human, radiologist at [facility]. Pt had xray done and told them she was to wait until Randall Hiss looked at [employer]. I told her to just have pt come by for the Linzess 72 mcg per Randall Hiss and I can call and tell her when to start taking it.

## 2018-01-15 NOTE — Telephone Encounter (Signed)
Pt had GES Friday. Said her swelling in her abdomen around her belly button is worse. When she eats anything, the swelling increases. She has not had a good BM for over a week. She has a little and never feels like she empties. Said she is not in any pain, the swelling is just bad. She is not having any shortness of breath.  She is aware Walden Field, NP is seeing pt's but I will address with him for recommendations.

## 2018-01-16 ENCOUNTER — Telehealth: Payer: Self-pay

## 2018-01-16 NOTE — Progress Notes (Signed)
Pt is aware and will start the Linzess this morning.

## 2018-01-16 NOTE — Telephone Encounter (Signed)
I called Radiology and spoke to [facility]. She is not sure why the report is not in yet. She will call [facility] and have them read it STAT.

## 2018-01-16 NOTE — Progress Notes (Signed)
Pt is aware of results and plan. I have mailed the gastroparesis diet.

## 2018-01-16 NOTE — Telephone Encounter (Signed)
Pt said she has not been to work since she had the [place] on 01/12/2018. She would like a note saying it is OK to return to work. ( I explained to her that we did not take her out of work for the test). Randall Hiss, please advise!

## 2018-01-16 NOTE — Telephone Encounter (Signed)
See report on Xray and GES.

## 2018-01-16 NOTE — Telephone Encounter (Signed)
Cheyenne Hicks, the report is now ready.

## 2018-01-22 ENCOUNTER — Telehealth: Payer: Self-pay | Admitting: Gastroenterology

## 2018-01-22 MED ORDER — LINACLOTIDE 145 MCG PO CAPS: 145.0000 ug | ORAL_CAPSULE | Freq: Every day | ORAL | 5 refills | Status: DC

## 2018-01-22 NOTE — Telephone Encounter (Signed)
See result note.  

## 2018-01-22 NOTE — Telephone Encounter (Signed)
We can give her a note that, from a GI perspective, she can return to work.  Not sure if someone wrote her out.

## 2018-01-22 NOTE — Telephone Encounter (Signed)
Patient made aware and letter mailed to the PO Box on file per the patient's request.

## 2018-01-22 NOTE — Telephone Encounter (Signed)
Increase linzess to 145 mcg (she can take two of the 72 mcg pills) and see if this helps any better.  Call and check on her progress in a few days.

## 2018-01-22 NOTE — Telephone Encounter (Signed)
Pt was calling to speak with the nurse about her medication and being swollen. Please call her at [phone]

## 2018-01-22 NOTE — Telephone Encounter (Signed)
Pt said the Linzess 72 mcg is not working. She drank prune juice and it helped some. She has been having small amounts of Bm, but not significant enough. She is swollen in her abdomen and her fee are so swollen that she cannot get her shoes on. She called PCP and has appt for tomorrow with her. She would like to know if she can have something different for her Bm's. Randall Hiss, please advise. ( Also, please see separate note about work note, pt still has not gone back to work because she cannot get her shoes on).

## 2018-01-22 NOTE — Telephone Encounter (Signed)
Patient made aware.

## 2018-01-22 NOTE — Telephone Encounter (Signed)
Patient does not have anymore Linzess please send Rx for 145 mcg to [facility] in [place]

## 2018-01-22 NOTE — Telephone Encounter (Signed)
I believe she had been taking Linzess 72 samples. I have sent in 145 mcg unless something changes.

## 2018-01-22 NOTE — Telephone Encounter (Signed)
Note has been written. Tried to call pt and vm not set up.

## 2018-01-22 NOTE — Telephone Encounter (Signed)
Tried to call, Vm not set up. =

## 2018-03-13 ENCOUNTER — Telehealth: Payer: Self-pay | Admitting: Nurse Practitioner

## 2018-03-13 ENCOUNTER — Ambulatory Visit: Payer: Medicare HMO | Admitting: Nurse Practitioner

## 2018-03-13 ENCOUNTER — Encounter: Payer: Self-pay | Admitting: Gastroenterology

## 2018-03-13 NOTE — Telephone Encounter (Signed)
PATIENT WAS A NO SHOW AND LETTER SENT  °

## 2018-03-13 NOTE — Progress Notes (Deleted)
Referring Provider: Alfonse Flavors Primary Care Physician:  Alfonse Flavors, MD Primary GI:  Dr. Oneida Alar  No chief complaint on file.   HPI:   Cheyenne Hicks is a 68 y.o. female who presents for abdominal pain, nausea, vomiting.  The patient was last seen in our office 11/08/2017 for cirrhosis likely due to fatty liver, abdominal pain, nausea and vomiting, GI bleed, abdominal distention.  At her last visit she was presenting for hospital follow-up due to admission from 08/28/2017 through 09/01/2017 for acute blood loss anemia.  At the time of ER visit she noted dark emesis with blood and dark tarry stools a week prior but had recently started on iron.  Some nausea as well.  Positive FOBT and hemoglobin 7.7.  EGD found grade 1 esophageal varices and AVMs with no active bleeding.  Recommended nadolol for bleeding prevention of varices and continue PPI, no NSAIDs.  At her last visit she felt she was not doing well and noted significant abdominal pain, bloating, nausea, vomiting.  This was postprandial.  No hematemesis.  Felt like "food was not digesting."  Some intermittent diarrhea but responds well to Imodium.  Feels like she is lost some weight recently with decreased appetite and her presenting symptoms.  She feels like food is "stuck in my stomach" and not digesting.  She was having bilateral lower extremity edema but primary care adjusted her diuretic dose which helped significantly.  No other GI or hepatic symptoms.  Of note, her cardiologist recently stopped her Plavix.  Recommended follow-up labs, gastric emptying test, gastroparesis diet, follow-up in 4 months.  Unfortunately it appears her labs, including CBC, hepatitis B serologies, ANA, smooth muscle antibody, mitochondrial antibody were not completed.  Gastric emptying study was completed 01/12/2018 and noted delayed gastric emptying suggesting a degree of gastroparesis.  These results were relayed to the patient and  reinforced gastroparesis diet.  A few days after her gastric emptying study she noted abdominal swelling and no good bowel movement for over a week.  Recommended abdominal x-ray and sample box of Linzess with 2-day follow-up by phone.  X-ray noted stool throughout the colon without bowel distention or signs of obstruction.  Patient called a few days later indicating Linzess 72 mcg was not helping, she drank prune juice which did help some.  Small amounts of bowel movement but not significant.  Abdominal swelling so bad that she cannot get her shoes on.  She has an appointment for PCP follow-up.  Recommended increase Linzess to 145 mcg.  Follow-up by phone in a couple days.  Today she states   Past Medical History:  Diagnosis Date  . CHF (congestive heart failure) ([place])   . Cirrhosis ([place])   . Diabetes mellitus without complication ([place])   . GERD (gastroesophageal reflux disease)   . Hyperlipidemia   . Hypertension   . NSTEMI (non-ST elevated myocardial infarction) ([place])    11/2014  . Portal hypertension ([facility])     Past Surgical History:  Procedure Laterality Date  . BIOPSY  08/31/2017   Procedure: BIOPSY;  Surgeon: Daneil Dolin, MD;  Location: [facility];  Service: Gastroenterology;;  gastric biopsy  . CORONARY ANGIOPLASTY WITH STENT PLACEMENT  05/2015   DES  . ESOPHAGOGASTRODUODENOSCOPY (EGD) WITH PROPOFOL N/A 08/31/2017   Procedure: ESOPHAGOGASTRODUODENOSCOPY (EGD) WITH PROPOFOL;  Surgeon: Daneil Dolin, MD;  Location: [facility];  Service: Gastroenterology;  Laterality: N/A;  . none    . VALVE REPLACEMENT  05/2015  transcatheter aortic valve replacement at [facility]    Current Outpatient Medications  Medication Sig Dispense Refill  . aspirin 81 MG tablet Take 81 mg by mouth daily.    . ferrous gluconate (FERGON) 324 MG tablet Take 324 mg by mouth every evening.   5  . furosemide (LASIX) 80 MG tablet Take 80 mg by mouth every morning.     . hydrOXYzine  (ATARAX/VISTARIL) 25 MG tablet Take 25 mg by mouth 3 (three) times daily.    . insulin detemir (LEVEMIR) 100 UNIT/ML injection Inject 36 Units into the skin every evening.    . linaclotide (LINZESS) 145 MCG CAPS capsule Take 1 capsule (145 mcg total) by mouth daily before breakfast. 30 capsule 5  . lisinopril (PRINIVIL,ZESTRIL) 2.5 MG tablet Take 2.5 mg by mouth every morning.     . metFORMIN (GLUCOPHAGE) 500 MG tablet Take 500 mg by mouth 2 (two) times daily with a meal.     . nadolol (CORGARD) 40 MG tablet Take 1 tablet (40 mg total) by mouth daily. 30 tablet 1  . pantoprazole (PROTONIX) 40 MG tablet Take 1 tablet (40 mg total) by mouth daily. 30 tablet 1  . ranitidine (ZANTAC) 150 MG tablet Take 150 mg by mouth at bedtime.    . simvastatin (ZOCOR) 40 MG tablet Take 40 mg by mouth daily at 6 PM.    . vitamin C (ASCORBIC ACID) 500 MG tablet Take 500 mg by mouth every evening.     No current facility-administered medications for this visit.     Allergies as of 03/13/2018 - Review Complete 11/08/2017  Allergen Reaction Noted  . Iodinated diagnostic agents Hives 04/08/2015  . Metoprolol Itching 11/25/2016    Family History  Problem Relation Age of Onset  . Heart attack Mother   . Liver disease Other        patient not aware of any  . Heart attack Sister        died age 21  . Colon cancer Neg Hx     Social History   Socioeconomic History  . Marital status: Single    Spouse name: Not on file  . Number of children: 1  . Years of education: Not on file  . Highest education level: Not on file  Occupational History  . Occupation: work at [employer]  . Financial resource strain: Not on file  . Food insecurity:    Worry: Not on file    Inability: Not on file  . Transportation needs:    Medical: Not on file    Non-medical: Not on file  Tobacco Use  . Smoking status: Current Some Day Smoker    Packs/day: 0.25    Types: Cigarettes  . Smokeless tobacco: Never  Used  Substance and Sexual Activity  . Alcohol use: Not Currently    Alcohol/week: 0.0 standard drinks    Comment: pint in 24 hours once-twice per week (for many years), stopped 3 years ago  . Drug use: No  . Sexual activity: Not on file  Lifestyle  . Physical activity:    Days per week: Not on file    Minutes per session: Not on file  . Stress: Not on file  Relationships  . Social connections:    Talks on phone: Not on file    Gets together: Not on file    Attends religious service: Not on file    Active member of club or organization: Not on file  Attends meetings of clubs or organizations: Not on file    Relationship status: Not on file  Other Topics Concern  . Not on file  Social History Narrative  . Not on file    Review of Systems: General: Negative for anorexia, weight loss, fever, chills, fatigue, weakness. Eyes: Negative for vision changes.  ENT: Negative for hoarseness, difficulty swallowing , nasal congestion. CV: Negative for chest pain, angina, palpitations, dyspnea on exertion, peripheral edema.  Respiratory: Negative for dyspnea at rest, dyspnea on exertion, cough, sputum, wheezing.  GI: See history of present illness. GU:  Negative for dysuria, hematuria, urinary incontinence, urinary frequency, nocturnal urination.  MS: Negative for joint pain, low back pain.  Derm: Negative for rash or itching.  Neuro: Negative for weakness, abnormal sensation, seizure, frequent headaches, memory loss, confusion.  Psych: Negative for anxiety, depression, suicidal ideation, hallucinations.  Endo: Negative for unusual weight change.  Heme: Negative for bruising or bleeding. Allergy: Negative for rash or hives.   Physical Exam: There were no vitals taken for this visit. General:   Alert and oriented. Pleasant and cooperative. Well-nourished and well-developed.  Head:  Normocephalic and atraumatic. Eyes:  Without icterus, sclera clear and conjunctiva pink.  Ears:   Normal auditory acuity. Mouth:  No deformity or lesions, oral mucosa pink.  Throat/Neck:  Supple, without mass or thyromegaly. Cardiovascular:  S1, S2 present without murmurs appreciated. Normal pulses noted. Extremities without clubbing or edema. Respiratory:  Clear to auscultation bilaterally. No wheezes, rales, or rhonchi. No distress.  Gastrointestinal:  +BS, soft, non-tender and non-distended. No HSM noted. No guarding or rebound. No masses appreciated.  Rectal:  Deferred  Musculoskalatal:  Symmetrical without gross deformities. Normal posture. Skin:  Intact without significant lesions or rashes. Neurologic:  Alert and oriented x4;  grossly normal neurologically. Psych:  Alert and cooperative. Normal mood and affect. Heme/Lymph/Immune: No significant cervical adenopathy. No excessive bruising noted.    03/13/2018 7:55 AM   Disclaimer: This note was dictated with voice recognition software. Similar sounding words can inadvertently be transcribed and may not be corrected upon review.

## 2018-04-22 DIAGNOSIS — K922 Gastrointestinal hemorrhage, unspecified: Secondary | ICD-10-CM

## 2018-04-22 HISTORY — DX: Gastrointestinal hemorrhage, unspecified: K92.2

## 2018-06-04 ENCOUNTER — Emergency Department (HOSPITAL_COMMUNITY): Payer: Medicare HMO

## 2018-06-04 ENCOUNTER — Emergency Department (HOSPITAL_COMMUNITY)
Admission: EM | Admit: 2018-06-04 | Discharge: 2018-06-04 | Payer: Medicare HMO | Attending: Emergency Medicine | Admitting: Emergency Medicine

## 2018-06-04 ENCOUNTER — Encounter (HOSPITAL_COMMUNITY): Payer: Self-pay

## 2018-06-04 ENCOUNTER — Other Ambulatory Visit: Payer: Self-pay

## 2018-06-04 DIAGNOSIS — Z79899 Other long term (current) drug therapy: Secondary | ICD-10-CM | POA: Insufficient documentation

## 2018-06-04 DIAGNOSIS — K7581 Nonalcoholic steatohepatitis (NASH): Secondary | ICD-10-CM | POA: Diagnosis not present

## 2018-06-04 DIAGNOSIS — I2510 Atherosclerotic heart disease of native coronary artery without angina pectoris: Secondary | ICD-10-CM | POA: Insufficient documentation

## 2018-06-04 DIAGNOSIS — E119 Type 2 diabetes mellitus without complications: Secondary | ICD-10-CM | POA: Insufficient documentation

## 2018-06-04 DIAGNOSIS — K921 Melena: Secondary | ICD-10-CM | POA: Insufficient documentation

## 2018-06-04 DIAGNOSIS — I252 Old myocardial infarction: Secondary | ICD-10-CM | POA: Insufficient documentation

## 2018-06-04 DIAGNOSIS — K219 Gastro-esophageal reflux disease without esophagitis: Secondary | ICD-10-CM | POA: Diagnosis not present

## 2018-06-04 DIAGNOSIS — Z952 Presence of prosthetic heart valve: Secondary | ICD-10-CM | POA: Diagnosis not present

## 2018-06-04 DIAGNOSIS — I110 Hypertensive heart disease with heart failure: Secondary | ICD-10-CM | POA: Insufficient documentation

## 2018-06-04 DIAGNOSIS — D696 Thrombocytopenia, unspecified: Secondary | ICD-10-CM | POA: Insufficient documentation

## 2018-06-04 DIAGNOSIS — D649 Anemia, unspecified: Secondary | ICD-10-CM

## 2018-06-04 DIAGNOSIS — E785 Hyperlipidemia, unspecified: Secondary | ICD-10-CM | POA: Insufficient documentation

## 2018-06-04 DIAGNOSIS — Z794 Long term (current) use of insulin: Secondary | ICD-10-CM | POA: Insufficient documentation

## 2018-06-04 DIAGNOSIS — D6489 Other specified anemias: Secondary | ICD-10-CM | POA: Diagnosis not present

## 2018-06-04 DIAGNOSIS — I509 Heart failure, unspecified: Secondary | ICD-10-CM | POA: Insufficient documentation

## 2018-06-04 DIAGNOSIS — F17210 Nicotine dependence, cigarettes, uncomplicated: Secondary | ICD-10-CM | POA: Insufficient documentation

## 2018-06-04 DIAGNOSIS — R188 Other ascites: Secondary | ICD-10-CM

## 2018-06-04 HISTORY — DX: Other ascites: R18.8

## 2018-06-04 HISTORY — DX: Esophageal varices without bleeding: I85.00

## 2018-06-04 HISTORY — DX: Unspecified cirrhosis of liver: K74.60

## 2018-06-04 HISTORY — DX: Nonalcoholic steatohepatitis (NASH): K75.81

## 2018-06-04 HISTORY — DX: Gastrointestinal hemorrhage, unspecified: K92.2

## 2018-06-04 LAB — COMPREHENSIVE METABOLIC PANEL
ALT: 17 U/L (ref 0–44)
AST: 36 U/L (ref 15–41)
Albumin: 2.9 g/dL — ABNORMAL LOW (ref 3.5–5.0)
Alkaline Phosphatase: 185 U/L — ABNORMAL HIGH (ref 38–126)
Anion gap: 11 (ref 5–15)
BUN: 24 mg/dL — ABNORMAL HIGH (ref 8–23)
CO2: 26 mmol/L (ref 22–32)
Calcium: 8.5 mg/dL — ABNORMAL LOW (ref 8.9–10.3)
Chloride: 94 mmol/L — ABNORMAL LOW (ref 98–111)
Creatinine, Ser: 1.44 mg/dL — ABNORMAL HIGH (ref 0.44–1.00)
GFR calc Af Amer: 44 mL/min — ABNORMAL LOW (ref >60)
GFR calc non Af Amer: 38 mL/min — ABNORMAL LOW (ref >60)
Glucose, Bld: 122 mg/dL — ABNORMAL HIGH (ref 70–99)
Potassium: 3.4 mmol/L — ABNORMAL LOW (ref 3.5–5.1)
Sodium: 131 mmol/L — ABNORMAL LOW (ref 135–145)
Total Bilirubin: 1.8 mg/dL — ABNORMAL HIGH (ref 0.3–1.2)
Total Protein: 6.8 g/dL (ref 6.5–8.1)

## 2018-06-04 LAB — TYPE AND SCREEN
ABO/RH(D): A POS
Antibody Screen: NEGATIVE

## 2018-06-04 LAB — PROTIME-INR
INR: 1.3 — ABNORMAL HIGH (ref 0.8–1.2)
Prothrombin Time: 15.7 seconds — ABNORMAL HIGH (ref 11.4–15.2)

## 2018-06-04 LAB — CBC WITH DIFFERENTIAL/PLATELET
Abs Immature Granulocytes: 0.04 10*3/uL (ref 0.00–0.07)
Basophils Absolute: 0.1 10*3/uL (ref 0.0–0.1)
Basophils Relative: 1 %
Eosinophils Absolute: 0.1 10*3/uL (ref 0.0–0.5)
Eosinophils Relative: 1 %
HCT: 23.3 % — ABNORMAL LOW (ref 36.0–46.0)
Hemoglobin: 7.1 g/dL — ABNORMAL LOW (ref 12.0–15.0)
Immature Granulocytes: 1 %
Lymphocytes Relative: 11 %
Lymphs Abs: 0.7 10*3/uL (ref 0.7–4.0)
MCH: 28.7 pg (ref 26.0–34.0)
MCHC: 30.5 g/dL (ref 30.0–36.0)
MCV: 94.3 fL (ref 80.0–100.0)
Monocytes Absolute: 0.6 10*3/uL (ref 0.1–1.0)
Monocytes Relative: 9 %
Neutro Abs: 4.8 10*3/uL (ref 1.7–7.7)
Neutrophils Relative %: 77 %
Platelets: 96 10*3/uL — ABNORMAL LOW (ref 150–400)
RBC: 2.47 MIL/uL — ABNORMAL LOW (ref 3.87–5.11)
RDW: 19.3 % — ABNORMAL HIGH (ref 11.5–15.5)
WBC: 6.2 10*3/uL (ref 4.0–10.5)
nRBC: 0 % (ref 0.0–0.2)

## 2018-06-04 LAB — TROPONIN I: Troponin I: <0.03 ng/mL (ref <0.03)

## 2018-06-04 LAB — BRAIN NATRIURETIC PEPTIDE: B Natriuretic Peptide: 30 pg/mL (ref 0.0–100.0)

## 2018-06-04 LAB — POC OCCULT BLOOD, ED: Fecal Occult Bld: POSITIVE — AB

## 2018-06-04 MED ORDER — SODIUM CHLORIDE 0.9 % IV SOLN
50.0000 ug/h | INTRAVENOUS | Status: DC
  Administered 2018-06-04: 50 ug/h via INTRAVENOUS
  Filled 2018-06-04: qty 5
  Filled 2018-06-04 (×2): qty 1

## 2018-06-04 MED ORDER — SODIUM CHLORIDE 0.9 % IV SOLN
INTRAVENOUS | Status: DC
  Administered 2018-06-04: 18:00:00 via INTRAVENOUS

## 2018-06-04 MED ORDER — SODIUM CHLORIDE 0.9 % IV SOLN
80.0000 mg | Freq: Once | INTRAVENOUS | Status: AC
  Administered 2018-06-04: 80 mg via INTRAVENOUS
  Filled 2018-06-04: qty 80

## 2018-06-04 MED ORDER — SODIUM CHLORIDE 0.9 % IV SOLN
8.0000 mg/h | INTRAVENOUS | Status: DC
  Administered 2018-06-04: 8 mg/h via INTRAVENOUS
  Filled 2018-06-04 (×3): qty 80

## 2018-06-04 NOTE — ED Triage Notes (Signed)
Pt brought in by [facility] due to ascites that has increased over the past 3 days. Pt had 10 L pulled off last week. Plus has swelling in lower ext. Pt is from [facility] [place] . Also reported black tarry stool

## 2018-06-04 NOTE — ED Notes (Addendum)
IV attempt x2 unsuccessful. Have notified another RN

## 2018-06-04 NOTE — ED Provider Notes (Signed)
[facility] EMERGENCY DEPARTMENT Provider Note   CSN: [id number] Arrival date & time: 06/04/18  1448    History   Chief Complaint Chief Complaint  Patient presents with  . Ascites    HPI Cheyenne Hicks is a 68 y.o. female.     HPI  Pt was seen at 1510. Per EMS, NH report and pt: c/o gradual onset and worsening of persistent ascites for the past 1 week, worse over the past 3 days. States she is "swollen and tight." Pt has hx last paracentesis (10L total removed) during admission to [facility] 05/20/18 - 05/28/2018 for GI bleed. Pt states she also has had "black tarry stools" for the past 2 days. Denies fevers, cough, COVID+ exposure. Denies abd pain, no N/V/D, no rash, no CP/palpitations, no SOB/cough, no back pain.     Past Medical History:  Diagnosis Date  . Ascites   . CHF (congestive heart failure) ([place])   . Cirrhosis ([place])   . Diabetes mellitus without complication ([place])   . Esophageal varices ([place])   . GERD (gastroesophageal reflux disease)   . GI bleed 04/2018   EGD with ulcer, esophagitis  . Hyperlipidemia   . Hypertension   . Liver cirrhosis secondary to NASH ([place])   . NSTEMI (non-ST elevated myocardial infarction) ([place])    11/2014  . Portal hypertension [facility])     Patient Active Problem List   Diagnosis Date Noted  . Abdominal pain 11/08/2017  . Nausea with vomiting 11/08/2017  . Occult GI bleeding   . Symptomatic anemia   . CAD (coronary artery disease) 08/28/2017  . Acute blood loss anemia 08/28/2017  . Portal hypertension ([place]) 08/28/2017  . Diabetes ([place]) 08/28/2017  . GI bleed 08/28/2017  . Cirrhosis ([place]) 01/26/2015  . Lower abdominal pain 01/26/2015  . GERD (gastroesophageal reflux disease) 01/26/2015    Past Surgical History:  Procedure Laterality Date  . BIOPSY  08/31/2017   Procedure: BIOPSY;  Surgeon: Daneil Dolin, MD;  Location: [facility] ENDO SUITE;  Service: Gastroenterology;;  gastric biopsy  . CORONARY ANGIOPLASTY WITH STENT PLACEMENT  05/2015    DES  . ESOPHAGOGASTRODUODENOSCOPY (EGD) WITH PROPOFOL N/A 08/31/2017   Procedure: ESOPHAGOGASTRODUODENOSCOPY (EGD) WITH PROPOFOL;  Surgeon: Daneil Dolin, MD;  Location: [facility] ENDO SUITE;  Service: Gastroenterology;  Laterality: N/A;  . none    . VALVE REPLACEMENT  05/2015   transcatheter aortic valve replacement at [facility]     OB History    Gravida      Para      Term      Preterm      AB      Living  1     SAB      TAB      Ectopic      Multiple      Live Births               Home Medications    Prior to Admission medications   Medication Sig Start Date End Date Taking? Authorizing Provider  ferrous gluconate (FERGON) 324 MG tablet Take 324 mg by mouth every morning.  08/18/17  Yes [provider]  furosemide (LASIX) 80 MG tablet Take 80 mg by mouth every morning.    Yes [provider]  insulin detemir (LEVEMIR) 100 UNIT/ML injection Inject 14 Units into the skin every evening.    Yes [provider]  metFORMIN (GLUCOPHAGE) 500 MG tablet Take 500 mg by mouth 2 (two) times daily with  a meal.    Yes [provider]  hydrOXYzine (ATARAX/VISTARIL) 25 MG tablet Take 25 mg by mouth 3 (three) times daily.    [provider]  lisinopril (PRINIVIL,ZESTRIL) 2.5 MG tablet Take 2.5 mg by mouth every morning.     [provider]  nadolol (CORGARD) 40 MG tablet Take 1 tablet (40 mg total) by mouth daily. 09/02/17   Barton Dubois, MD  pantoprazole (PROTONIX) 40 MG tablet Take 1 tablet (40 mg total) by mouth daily. 09/02/17   Barton Dubois, MD  ranitidine (ZANTAC) 150 MG tablet Take 150 mg by mouth at bedtime.    [provider]  simvastatin (ZOCOR) 40 MG tablet Take 40 mg by mouth daily at 6 PM.    [provider]  vitamin C (ASCORBIC ACID) 500 MG tablet Take 500 mg by mouth every evening.    [provider]    Family History Family History  Problem Relation Age of Onset  . Heart attack  Mother   . Liver disease Other        patient not aware of any  . Heart attack Sister        died age 83  . Colon cancer Neg Hx     Social History Social History   Tobacco Use  . Smoking status: Current Some Day Smoker    Packs/day: 0.25    Types: Cigarettes  . Smokeless tobacco: Never Used  Substance Use Topics  . Alcohol use: Not Currently    Alcohol/week: 0.0 standard drinks  . Drug use: No     Allergies   Iodinated diagnostic agents and Metoprolol   Review of Systems Review of Systems ROS: Statement: All systems negative except as marked or noted in the HPI; Constitutional: Negative for fever and chills. ; ; Eyes: Negative for eye pain, redness and discharge. ; ; ENMT: Negative for ear pain, hoarseness, nasal congestion, sinus pressure and sore throat. ; ; Cardiovascular: Negative for chest pain, palpitations, diaphoresis, dyspnea and +peripheral edema. ; ; Respiratory: Negative for cough, wheezing and stridor. ; ; Gastrointestinal: Negative for nausea, vomiting, diarrhea, abdominal pain, blood in stool, hematemesis, jaundice and +abd "swelling," "black stools.". ; ; Genitourinary: Negative for dysuria, flank pain and hematuria. ; ; Musculoskeletal: Negative for back pain and neck pain. Negative for swelling and trauma.; ; Skin: Negative for pruritus, rash, abrasions, blisters, bruising and skin lesion.; ; Neuro: Negative for headache, lightheadedness and neck stiffness. Negative for weakness, altered level of consciousness, altered mental status, extremity weakness, paresthesias, involuntary movement, seizure and syncope.       Physical Exam Updated Vital Signs BP 116/60 (BP Location: Right Arm)   Pulse (!) 116   Temp 98.2 F (36.8 C) (Oral)   Resp (!) 22   Ht 5' 3"  (1.6 m)   Wt 79.8 kg   BMI 31.18 kg/m    Patient Vitals for the past 24 hrs:  BP Temp Temp src Pulse Resp SpO2 Height Weight  06/04/18 1605 - - - - - 98 % - -  06/04/18 1603 - - - - - 100 % - -   06/04/18 1600 (!) 108/59 - - - (!) 22 - - -  06/04/18 1457 116/60 98.2 F (36.8 C) Oral (!) 116 (!) 22 - - -  06/04/18 1452 - - - - - - 5' 3"  (1.6 m) 79.8 kg     Physical Exam 1515: Physical examination:  Nursing notes reviewed; Vital signs and O2 SAT reviewed;  Constitutional: Well developed, Well nourished, Well hydrated, In no acute distress; Head:  Normocephalic, atraumatic; Eyes: EOMI, PERRL, No scleral icterus; ENMT: Mouth and pharynx normal, Mucous membranes moist; Neck: Supple, Full range of motion, No lymphadenopathy; Cardiovascular: Tachycardic rate and rhythm, No gallop; Respiratory: Breath sounds clear & equal bilaterally, No wheezes.  Speaking full sentences with ease, Normal respiratory effort/excursion; Chest: Nontender, Movement normal; Abdomen: Soft, Nontender, +tense, distended. Normal bowel sounds. Rectal exam performed w/permission of pt and ED Tech chaperone present.  Anal tone normal.  Non-tender, soft black stool in rectal vault, heme positive.  No fissures, no external hemorrhoids, no palp masses.; Genitourinary: No CVA tenderness; Extremities: Peripheral pulses normal, No tenderness, +2 pedal edema bilat. No calf tenderness or asymmetry.; Neuro: AA&Ox3, Major CN grossly intact.  Speech clear. No gross focal motor deficits in extremities.; Skin: Color normal, Warm, Dry.   ED Treatments / Results  Labs (all labs ordered are listed, but only abnormal results are displayed)   EKG EKG Interpretation  Date/Time:  Monday June 04 2018 14:56:07 EDT Ventricular Rate:  115 PR Interval:    QRS Duration: 116 QT Interval:  329 QTC Calculation: 455 R Axis:   17 Text Interpretation:  Sinus tachycardia Nonspecific intraventricular conduction delay Low voltage, precordial leads Probable anteroseptal infarct, old Artifact No old tracing to compare Confirmed by Francine Graven [phone]) on 06/04/2018 3:24:32 PM   Radiology   Procedures Procedures (including critical care time)   Medications Ordered in ED Medications - No data to display   Initial Impression / Assessment and Plan / ED Course  I have reviewed the triage vital signs and the nursing notes.  Pertinent labs & imaging results that were available during my care of the patient were reviewed by me and considered in my medical decision making (see chart for details).     MDM Reviewed: previous chart, nursing note and vitals Reviewed previous: labs Interpretation: labs, ECG and x-ray Total time providing critical care: 30-74 minutes. This excludes time spent performing separately reportable procedures and services. Consults: gastrointestinal   CRITICAL CARE Performed by: Francine Graven Total critical care time: 35 minutes Critical care time was exclusive of separately billable procedures and treating other patients. Critical care was necessary to treat or prevent imminent or life-threatening deterioration. Critical care was time spent personally by me on the following activities: development of treatment plan with patient and/or surrogate as well as nursing, discussions with consultants, evaluation of patient's response to treatment, examination of patient, obtaining history from patient or surrogate, ordering and performing treatments and interventions, ordering and review of laboratory studies, ordering and review of radiographic studies, pulse oximetry and re-evaluation of patient's condition.   Results for orders placed or performed during the hospital encounter of 06/04/18  Comprehensive metabolic panel  Result Value Ref Range   Sodium 131 (L) 135 - 145 mmol/L   Potassium 3.4 (L) 3.5 - 5.1 mmol/L   Chloride 94 (L) 98 - 111 mmol/L   CO2 26 22 - 32 mmol/L   Glucose, Bld 122 (H) 70 - 99 mg/dL   BUN 24 (H) 8 - 23 mg/dL   Creatinine, Ser 1.44 (H) 0.44 - 1.00 mg/dL   Calcium 8.5 (L) 8.9 - 10.3 mg/dL   Total Protein 6.8 6.5 - 8.1 g/dL   Albumin 2.9 (L) 3.5 - 5.0 g/dL   AST 36 15 - 41 U/L    ALT 17 0 - 44 U/L   Alkaline Phosphatase 185 (H) 38 - 126 U/L  Total Bilirubin 1.8 (H) 0.3 - 1.2 mg/dL   GFR calc non Af Amer 38 (L) >60 mL/min   GFR calc Af Amer 44 (L) >60 mL/min   Anion gap 11 5 - 15  Protime-INR  Result Value Ref Range   Prothrombin Time 15.7 (H) 11.4 - 15.2 seconds   INR 1.3 (H) 0.8 - 1.2  CBC with Differential  Result Value Ref Range   WBC 6.2 4.0 - 10.5 K/uL   RBC 2.47 (L) 3.87 - 5.11 MIL/uL   Hemoglobin 7.1 (L) 12.0 - 15.0 g/dL   HCT 23.3 (L) 36.0 - 46.0 %   MCV 94.3 80.0 - 100.0 fL   MCH 28.7 26.0 - 34.0 pg   MCHC 30.5 30.0 - 36.0 g/dL   RDW 19.3 (H) 11.5 - 15.5 %   Platelets 96 (L) 150 - 400 K/uL   nRBC 0.0 0.0 - 0.2 %   Neutrophils Relative % 77 %   Neutro Abs 4.8 1.7 - 7.7 K/uL   Lymphocytes Relative 11 %   Lymphs Abs 0.7 0.7 - 4.0 K/uL   Monocytes Relative 9 %   Monocytes Absolute 0.6 0.1 - 1.0 K/uL   Eosinophils Relative 1 %   Eosinophils Absolute 0.1 0.0 - 0.5 K/uL   Basophils Relative 1 %   Basophils Absolute 0.1 0.0 - 0.1 K/uL   Immature Granulocytes 1 %   Abs Immature Granulocytes 0.04 0.00 - 0.07 K/uL  Brain natriuretic peptide  Result Value Ref Range   B Natriuretic Peptide 30.0 0.0 - 100.0 pg/mL  Troponin I - Once  Result Value Ref Range   Troponin I <0.03 <0.03 ng/mL  POC occult blood, ED  Result Value Ref Range   Fecal Occult Bld POSITIVE (A) NEGATIVE  Type and screen [facility]  Result Value Ref Range   ABO/RH(D) A POS    Antibody Screen NEG    Sample Expiration      06/07/2018 Performed at [facility], [address], [place], [place] [address]     Care Everywhere records:  CBC (05/28/2018 5:47 AM EDT) CBC (05/28/2018 5:47 AM EDT)  Component Value Ref Range Performed At Pathologist Signature  WBC 4.6 4.5 - 11.0 10*9/L [facility]   RBC 2.56 (L) 4.00 - 5.20 10*12/L [facility]   HGB 7.2 (L) 12.0 - 16.0 g/dL [facility]   HCT 23.7  (L) 36.0 - 46.0 % [facility]   MCV 92.8 80.0 - 100.0 fL [facility]   MCH 28.2 26.0 - 34.0 pg [facility]   MCHC 30.4 (L) 31.0 - 37.0 g/dL [facility]   RDW 17.2 (H) 12.0 - 15.0 % [facility]   MPV 10.8 (H) 7.0 - 10.0 fL [facility]   Platelet 79 (L) 150 - 440 10*9/L [facility]     PT-INR (05/26/2018 4:21 AM EDT) PT-INR (05/26/2018 4:21 AM EDT)  Component Value Ref Range Performed At Pathologist Signature  PT 17.2 (H) 10.2 - 13.1 sec [facility]   INR 1.48  [facility]    Comprehensive Metabolic Panel (69/62/9528 5:47 AM EDT) Comprehensive Metabolic Panel (41/32/4401 5:47 AM EDT)  Component Value Ref Range Performed At Pathologist Signature  Sodium 136 135 - 145 mmol/L [facility]   Potassium 3.7 3.5 - 5.0 mmol/L [facility]   Chloride 100 98 -  107 mmol/L [facility]   Anion Gap 8 7 - 15 mmol/L [facility]   CO2 28.0 22.0 - 30.0 mmol/L [facility]   BUN 15 7 - 21 mg/dL [facility]   Creatinine 1.10 (H) 0.60 - 1.00 mg/dL [facility]   BUN/Creatinine Ratio 14  [facility]   EGFR CKD-EPI Non-African American, Female 53 (L) >=60 mL/min/1.70m [facility]   EGFR CKD-EPI African American, Female 64>=60 mL[facility]/1.[facility]   Glucose 86 70 - 179 mg/dL [facility]   Calcium 8.4 (L) 8.5 - 10.2 mg/dL [facility]   Albumin 2.5 (L) 3.5 - 5.0 g/dL [facility]   Total Protein 5.5 (L) 6.5 - 8.3 g/dL [facility]   Total  Bilirubin 1.8 (H) 0.0 - 1.2 mg/dL [facility]   AST 42 (H) 14 - 38 U/L [facility]   ALT 16 <35 U/L [facility]   Alkaline Phosphatase 148 (H)       Last Filed Vital Signs - documented in this encounter Vital Sign Reading Time Taken Comments  Blood Pressure 97/43 05/28/2018 10:53 AM EDT   Pulse 88 05/28/2018 10:53 AM EDT   Temperature 36.7 C (98.1 F) 05/28/2018 10:53 AM EDT   Respiratory Rate 18 05/28/2018 10:53 AM EDT   Oxygen Saturation 100% 05/28/2018 10:53 AM EDT   Inhaled Oxygen Concentration - -   Weight 80.7 kg (178 lb) 05/28/2018 10:57 AM EDT   Height 160 cm (5' 2.99") 05/28/2018 10:57 AM EDT   Body Mass Index 31.54 05/28/2018 10:57 AM EDT      GlMarco Collieas evaluated in Emergency Department on 06/04/2018 for the symptoms described in the history of present illness. She was evaluated in the context of the global COVID-19 pandemic, which necessitated consideration that the patient might be at risk for infection with the SARS-CoV-2 virus that causes COVID-19. Institutional protocols and algorithms that pertain to the evaluation of patients at risk for COVID-19 are in a state of rapid change based on information released by regulatory bodies including the CDC and federal and state organizations. These policies and algorithms were followed during the patient's care in the ED.    1705:  [facility] and labs as above. H/H stable today; stool is heme positive. Pt continues to deny any CP/SOB/abd pain. Abd benign on exam, resps without distress, NAD. Sats remain 97%+ on R/A. No visible SOB or hypoxia with pt spontaneously moving around on stretcher. No fluid overload on CXR. No clear indication for need for emergent paracentesis at this time (and no Rads on site). Concerned regarding GI bleed; will need admit.  [facility] BaDrema Dallascase discussed, including:  HPI, pertinent  PM/SHx, VS/PE, dx testing, ED course and treatment:  Agreeable to accept transfer/admit, start IV PPI and octreotide, send to ED. EDP Dr. KaRon Parkeriven report. IV protonix bolus/gtt and IV octreotide gtt started. Dx and testing d/w pt.  Questions answered.  Verb understanding[facility]       Final Clinical Impressions(s) / ED Diagnoses   Final diagnoses:  Ascites    ED Discharge Orders    None       McFrancine GravenDO 06/08/18 1535

## 2018-06-04 NOTE — ED Notes (Signed)
[facility] transport on unit

## 2018-06-05 MED ORDER — SUCRALFATE 1 GM/10ML PO SUSP
1.00 | ORAL | Status: DC
Start: 2018-06-05 — End: 2018-06-05

## 2018-06-05 MED ORDER — SODIUM CHLORIDE 0.9 % IV SOLN
INTRAVENOUS | Status: DC
Start: ? — End: 2018-06-05

## 2018-06-05 MED ORDER — INSULIN LISPRO 100 UNIT/ML ~~LOC~~ SOLN
0.00 | SUBCUTANEOUS | Status: DC
Start: 2018-06-05 — End: 2018-06-05

## 2018-06-05 MED ORDER — ALBUMIN HUMAN 25 % IV SOLN
50.00 | INTRAVENOUS | Status: DC
Start: 2018-06-05 — End: 2018-06-05

## 2018-06-05 MED ORDER — PRAVASTATIN SODIUM 40 MG PO TABS
80.00 | ORAL_TABLET | ORAL | Status: DC
Start: 2018-06-05 — End: 2018-06-05

## 2018-06-05 MED ORDER — INSULIN GLARGINE 100 UNIT/ML ~~LOC~~ SOLN
10.00 | SUBCUTANEOUS | Status: DC
Start: 2018-06-05 — End: 2018-06-05

## 2018-06-05 MED ORDER — VITAMIN D (ERGOCALCIFEROL) 1.25 MG (50000 UT) PO CAPS
50000.00 | ORAL_CAPSULE | ORAL | Status: DC
Start: 2018-06-08 — End: 2018-06-05

## 2018-06-05 MED ORDER — GLUCOSE 40 % PO GEL
ORAL | Status: DC
Start: ? — End: 2018-06-05

## 2018-06-05 MED ORDER — NICOTINE 21 MG/24HR TD PT24
1.00 | MEDICATED_PATCH | TRANSDERMAL | Status: DC
Start: 2018-06-06 — End: 2018-06-05

## 2018-06-05 MED ORDER — PANTOPRAZOLE SODIUM 40 MG IV SOLR
40.00 | INTRAVENOUS | Status: DC
Start: 2018-06-05 — End: 2018-06-05

## 2018-12-12 IMAGING — US US ABDOMEN COMPLETE
1 series · 13 of 25 positions shown · non-contrast
Comparison: None.

CLINICAL DATA: 67-year-old female with hepatic cirrhosis and portal
hypertension

EXAM:
ABDOMEN ULTRASOUND COMPLETE

[Series 1: us abdomen complete · 0.16mm/px · 13 of 110 slices shown]
[im 1/110]
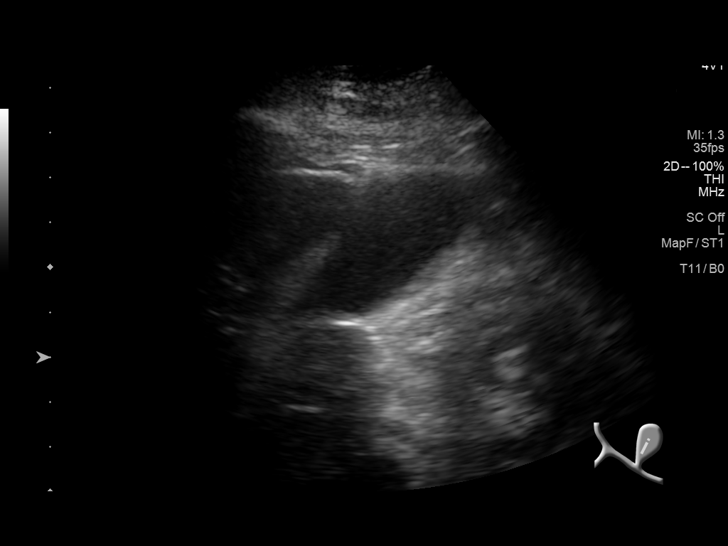
[im 10/110]
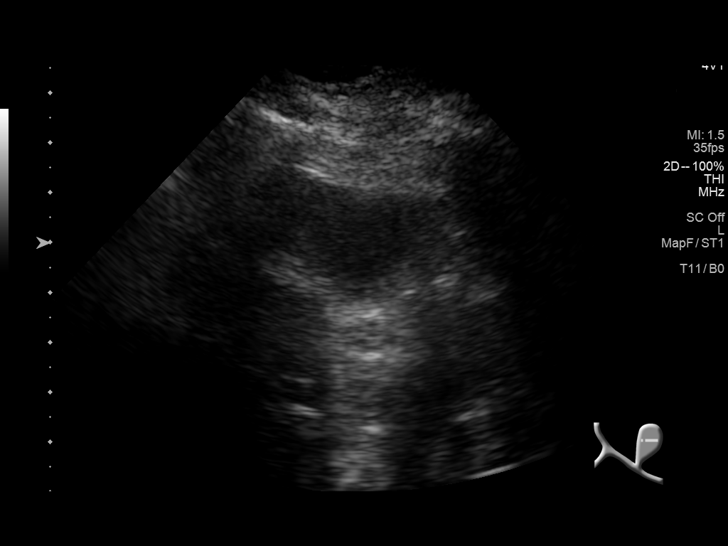
[im 19/110]
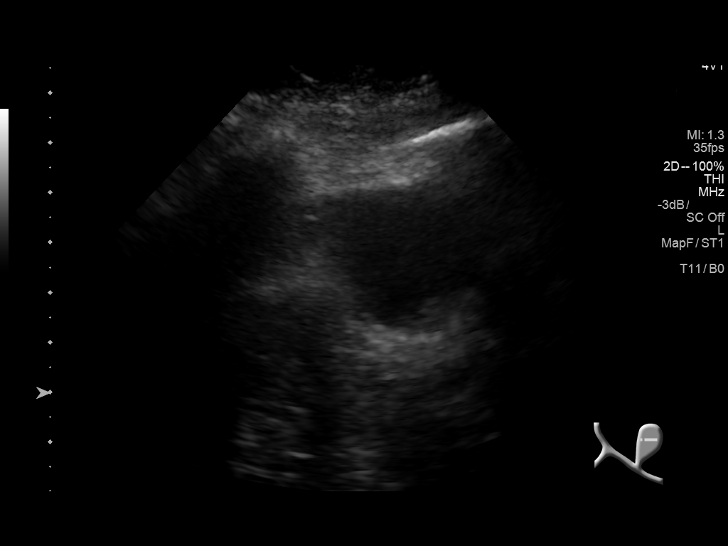
[im 28/110]
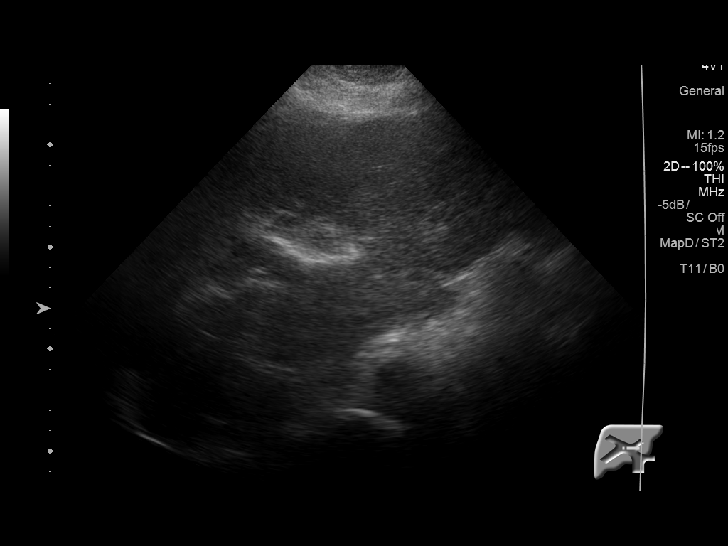
[im 37/110]
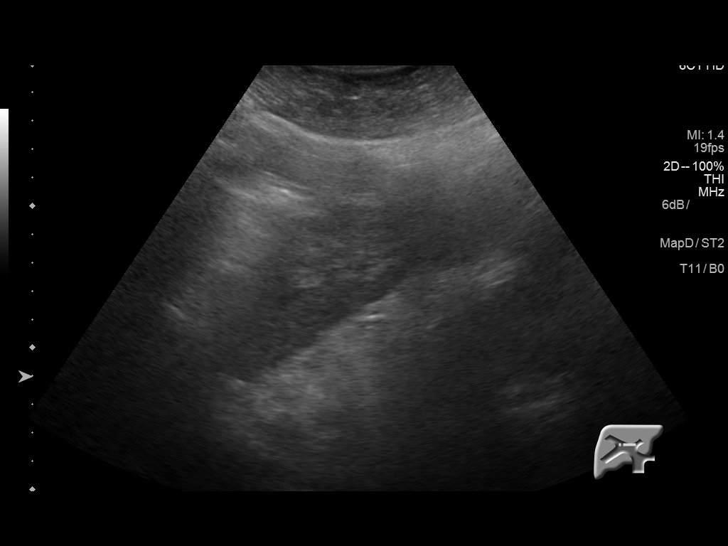
[im 46/110]
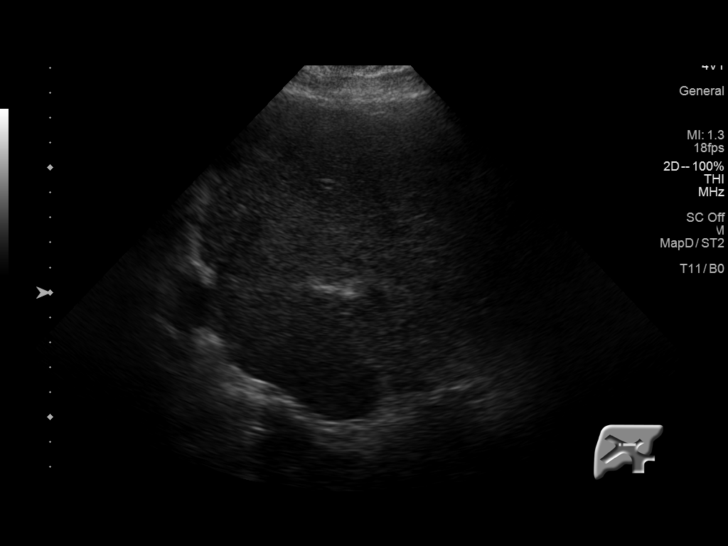
[im 55/110]
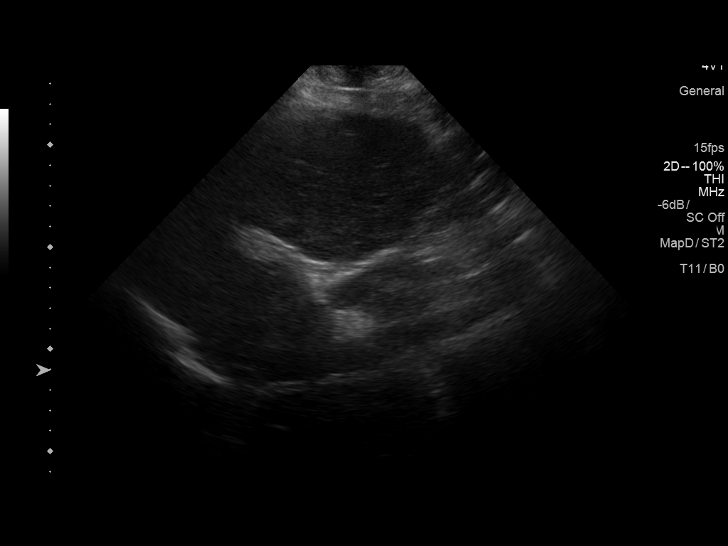
[im 64/110]
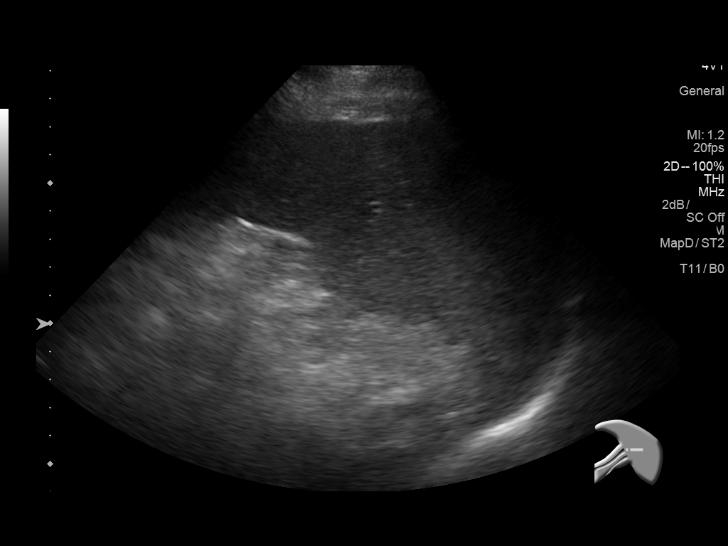
[im 73/110]
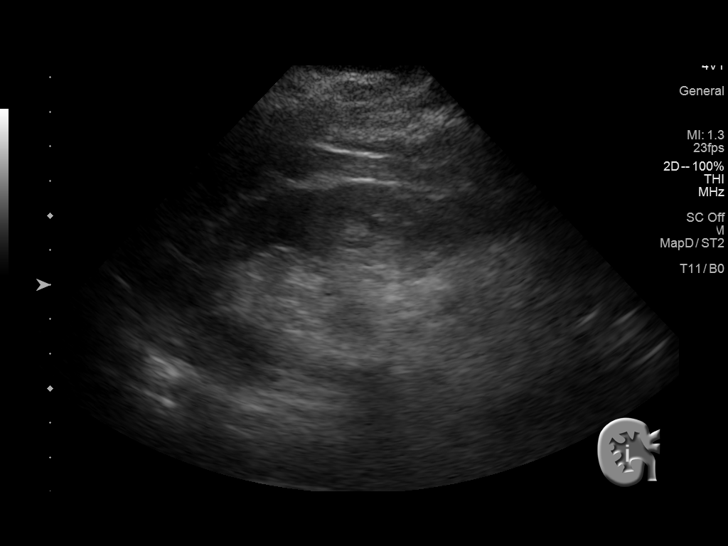
[im 82/110]
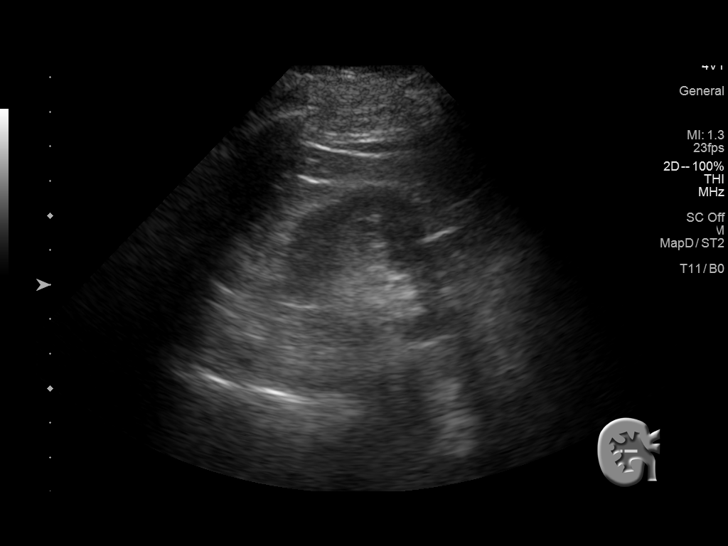
[im 91/110]
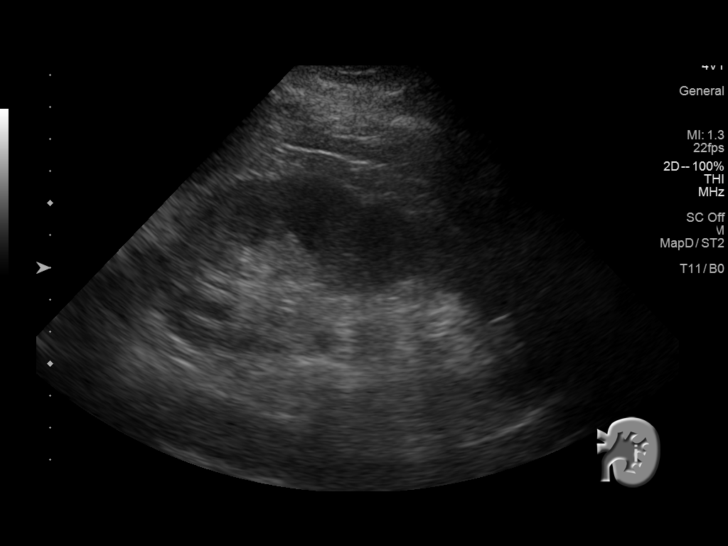
[im 100/110]
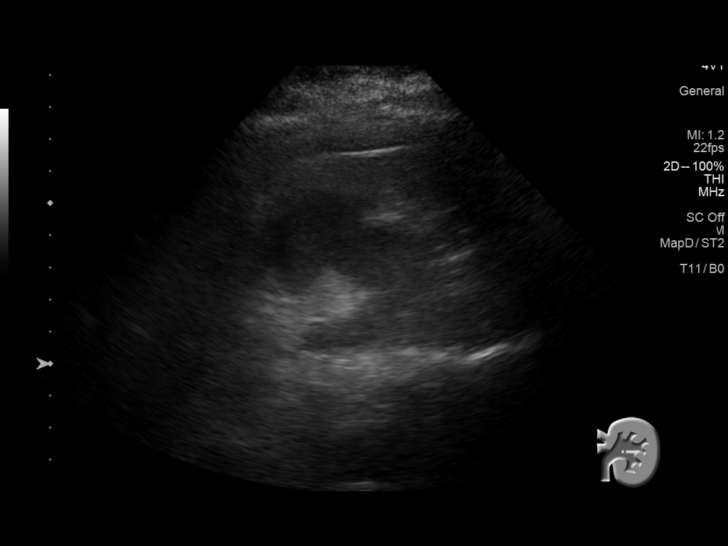
[im 110/110]
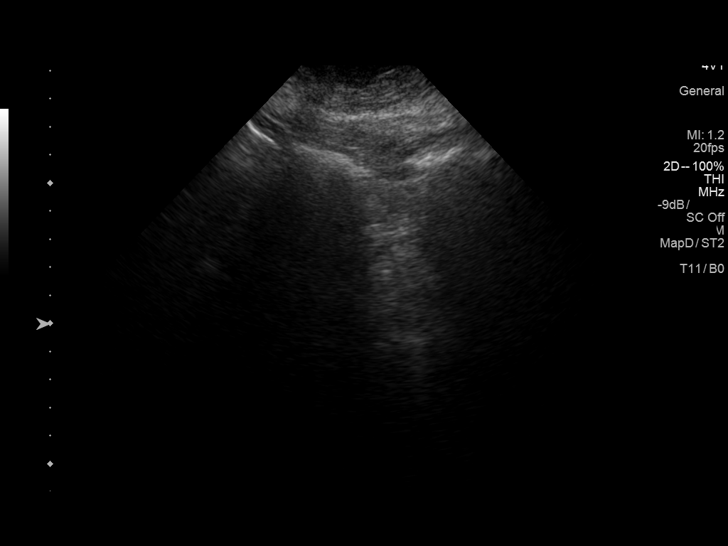

[13 of 25 positions shown; findings below may reference images not displayed]

FINDINGS: Gallbladder: No gallstones or wall thickening visualized. No
sonographic Murphy sign noted by sonographer.

Common bile duct: Diameter: Within normal limits at 3 mm

Liver: The liver parenchyma is heterogeneous in echogenicity and
coarsened in echotexture. The contour is lobular consistent with
cirrhotic change. No discrete lesions are identified. Portal vein is
patent on color Doppler imaging with normal direction of blood flow
towards the liver.

IVC: No abnormality visualized.

Pancreas: Visualized portion unremarkable.

Spleen: The spleen is enlarged measuring up to 15.2 cm in length and
945 cubic cm in volume.

Right Kidney: Length: 12.7 cm. Echogenicity within normal limits. No
mass or hydronephrosis visualized.

Left Kidney: Length: 13.2 cm. Echogenicity within normal limits. No
mass or hydronephrosis visualized.

Abdominal aorta: No aneurysm visualized in the imaged portions. The
distal aorta and aortic bifurcation are largely obscured by
overlying bowel gas.

Other findings: None.
IMPRESSION: 1. Hepatic cirrhosis and splenomegaly consistent with the clinical
history of portal hypertension. No visible ascites.
2. No acute abnormality.

## 2018-12-14 ENCOUNTER — Telehealth: Payer: Self-pay | Admitting: Adult Health Nurse Practitioner

## 2018-12-14 NOTE — Telephone Encounter (Signed)
Spoke with patient regarding Palliative services and she was in agreement with this.  I have scheduled a Telephone Consult for 12/18/18 @ 9 AM.

## 2018-12-18 ENCOUNTER — Other Ambulatory Visit: Payer: Self-pay

## 2018-12-18 ENCOUNTER — Other Ambulatory Visit: Payer: Medicare HMO | Admitting: Adult Health Nurse Practitioner

## 2018-12-18 DIAGNOSIS — Z515 Encounter for palliative care: Secondary | ICD-10-CM

## 2018-12-18 NOTE — Progress Notes (Signed)
[facility] Pall[facility] Consult Note Telephone: [phone]  Fax: [phone]  PATIENT NAME: Cheyenne Hicks DOB: [birth year] MRN: [id number]  PRIMARY CARE PROVIDER:   Alfonse Flavors, MD  REFERRING PROVIDER:  Alfonse Flavors, MD [address] [place],  [place] [address]  RESPONSIBLE PARTY:   Self [phone] Reighan Hipolito, son [phone]  Due to the COVID-19 crisis, this visit was done via telemedicine and it was initiated and consent by this patient and or family. Video-audio (telehealth) contact was unable to be done due to technical barriers from the patient's side.     RECOMMENDATIONS and PLAN:  1.  Advanced care planning.  Patient is a full code.  Tried discussing ACP with her and she said that her son does that.  Asked if I could talk with her son about it and she said she would have him call me.  She has palliative number and instructed her to ask for myself or Cheyenne Hicks when calling to discuss ACP or concerns.  2.  Cirrhosis of the liver with ascites.  Patient sees GI in [place] every 2 weeks to have paracentesis for her ascites.  Denies any increased SOB, cough, N/V.  Feels like she is tolerating the paracentesis well.  Continue follow up and recommendations by GI.  3.  CHF.  Patient has edema in bilateral legs.  States that it is no more than usual but does cause heaviness.  Does not use TED hose.  Denies SOB, cough, chest pain, weeping.  This is comanaged with cardiology.  Would recommend TED hose if patient would wear them.  Continue follow up and recommendations by cardiology.   4.  Functional status.  Notes from PCP show a decline in functional status.  Patient states that she uses a walker to ambulate around the house.  States that she is able to do her own ADLs.  Does state that she could use some help with house cleaning and is able to prepare her own meals.  Her niece stays with her at night and helps out. States that  she was receiving PT through [facility].  States that they were in the home last week and she was told they would come back but she has not heard anything back from them.  Have reached out to clinical navigator to confirm if still receiving home health services.  Will reach out to SW to help with resources in the home.    Patient does state that she has being feeling sick to her stomach but thinks it is something she ate.  Denies vomiting, diarrhea, constipation, abdominal pain, fever. Recommend visit in 4-6 weeks to go over ACP with her and family.  Recommend in person visit for this to better assess understanding.    I spent 30 minutes providing this consultation,  from 9:00 to 9:30. More than 50% of the time in this consultation was spent coordinating communication.   HISTORY OF PRESENT ILLNESS:  Cheyenne Hicks is a 68 y.o. year old female with multiple medical problems including recurrent GI bleed, CAD, CHF, cirrhosis of the liver with ascites, CKD, HTN. Palliative Care was asked to help address goals of care.   CODE STATUS: full code  PPS: 60% HOSPICE ELIGIBILITY/DIAGNOSIS: TBD  PHYSICAL EXAM:   Deferred  PAST MEDICAL HISTORY:  Past Medical History:  Diagnosis Date  . Ascites   . CHF (congestive heart failure) ([place])   . Cirrhosis ([place])   . Diabetes mellitus  without complication ([place])   . Esophageal varices ([place])   . GERD (gastroesophageal reflux disease)   . GI bleed 04/2018   EGD with ulcer, esophagitis  . Hyperlipidemia   . Hypertension   . Liver cirrhosis secondary to NASH ([place])   . NSTEMI (non-ST elevated myocardial infarction) ([place])    11/2014  . Portal hypertension ([facility])     SOCIAL HX:  Social History   Tobacco Use  . Smoking status: Current Some Day Smoker    Packs/day: 0.25    Types: Cigarettes  . Smokeless tobacco: Never Used  Substance Use Topics  . Alcohol use: Not Currently    Alcohol/week: 0.0 standard drinks    ALLERGIES:  Allergies   Allergen Reactions  . Iodinated Diagnostic Agents Hives    Reports severe hives and syncope after heart cath  . Metoprolol Itching     PERTINENT MEDICATIONS:  Outpatient Encounter Medications as of 12/18/2018  Medication Sig  . acetaminophen (TYLENOL) 650 MG CR tablet Take 650 mg by mouth every 6 (six) hours as needed for pain.  . brimonidine (ALPHAGAN) 0.2 % ophthalmic solution Place 1 drop into both eyes 3 (three) times daily.  . bumetanide (BUMEX) 1 MG tablet Take 1 mg by mouth daily.  . ferrous gluconate (FERGON) 324 MG tablet Take 324 mg by mouth every morning.   . folic acid (FOLVITE) 1 MG tablet Take 1 mg by mouth daily.  . furosemide (LASIX) 80 MG tablet Take 80 mg by mouth every morning.   . gabapentin (NEURONTIN) 100 MG capsule Take 100 mg by mouth 3 (three) times daily.  . insulin detemir (LEVEMIR) 100 UNIT/ML injection Inject 14 Units into the skin every evening.   . latanoprost (XALATAN) 0.005 % ophthalmic solution Place 1 drop into both eyes at bedtime.  Marland Kitchen linagliptin (TRADJENTA) 5 MG TABS tablet Take 5 mg by mouth daily.  . metFORMIN (GLUCOPHAGE) 500 MG tablet Take 500 mg by mouth 2 (two) times daily with a meal.   . metoCLOPramide (REGLAN) 5 MG tablet Take 5 mg by mouth 3 (three) times daily.  . Multiple Vitamin (MULTIVITAMIN WITH MINERALS) TABS tablet Take 1 tablet by mouth daily.  . pantoprazole (PROTONIX) 40 MG tablet Take 1 tablet (40 mg total) by mouth daily. (Patient taking differently: Take 40 mg by mouth 2 (two) times daily. )  . simvastatin (ZOCOR) 40 MG tablet Take 40 mg by mouth daily at 6 PM.  . Skin Protectants, Misc. (EUCERIN) cream Apply 1 application topically daily as needed for dry skin.  Marland Kitchen spironolactone (ALDACTONE) 100 MG tablet Take 200 mg by mouth daily.  . sucralfate (CARAFATE) 1 GM/10ML suspension Take 1 g by mouth 4 (four) times daily -  with meals and at bedtime.  . traMADol (ULTRAM) 50 MG tablet Take 50 mg by mouth every 6 (six) hours as needed  for moderate pain.  . Vitamin D, Ergocalciferol, (DRISDOL) 1.25 MG (50000 UT) CAPS capsule Take 50,000 Units by mouth every Monday.   No facility-administered encounter medications on file as of 12/18/2018.       Brandon Scarbrough Jenetta Downer, NP

## 2019-01-08 ENCOUNTER — Telehealth: Payer: Self-pay | Admitting: Nurse Practitioner

## 2019-01-08 NOTE — Telephone Encounter (Signed)
I called Ms. Cheyenne Hicks, only number listed for [facility] for f/u palliative care visit, unable to leave a message

## 2019-01-09 NOTE — Telephone Encounter (Signed)
Scheduled palliative appointment

## 2019-01-11 ENCOUNTER — Telehealth: Payer: Self-pay

## 2019-01-11 NOTE — Telephone Encounter (Signed)
SW received request from [place], NP regarding in-home assistance. SW contacted patient. Patient provided overview of care concerns, including her need for assistance with bathing and dressing. Patient reports that she has Medicaid. SW provided education on Personal Care Services [facility]) and patient expressed interest. SW to confirm patient has Medicaid and follow-up with PCP. SW provided contact information for any additional questions.

## 2019-01-21 ENCOUNTER — Other Ambulatory Visit: Payer: Self-pay

## 2019-01-21 ENCOUNTER — Other Ambulatory Visit: Payer: Medicare HMO | Admitting: Primary Care

## 2019-01-21 DIAGNOSIS — Z515 Encounter for palliative care: Secondary | ICD-10-CM

## 2019-01-21 NOTE — Progress Notes (Signed)
Home visit made per appt made. Patient not at home. Literature left. Tried to phone x 2 but no ability to leave message.

## 2019-01-23 ENCOUNTER — Other Ambulatory Visit: Payer: Medicare HMO | Admitting: Nurse Practitioner

## 2019-01-28 ENCOUNTER — Other Ambulatory Visit: Payer: Medicare HMO | Admitting: Primary Care

## 2019-01-28 ENCOUNTER — Other Ambulatory Visit: Payer: Medicare HMO | Admitting: Nurse Practitioner

## 2019-01-28 ENCOUNTER — Other Ambulatory Visit: Payer: Self-pay

## 2019-01-28 DIAGNOSIS — Z515 Encounter for palliative care: Secondary | ICD-10-CM

## 2019-01-28 NOTE — Progress Notes (Signed)
[facility] Consult Note Telephone: [phone]  Fax: [phone]   PATIENT NAME: Cheyenne Hicks No address on file. [phone] (home)  DOB: Jul 02, 1950 MRN: [id number]  PRIMARY CARE PROVIDER:   Alfonse Flavors, MD, [address] [place] [address] [phone]  REFERRING PROVIDER:  Alfonse Flavors, MD [address] [place],  [place] [address] [phone]  RESPONSIBLE PARTY:   Extended Emergency Contact Information Primary Emergency Contact: Angie Fava [place] of [place] Phone: [phone] Relation: Sister Secondary Emergency Contact: [place] of [place] Phone: [phone] Relation: Son   ASSESSMENT AND RECOMMENDATIONS:   1. Advance Care Planning/Goals of Care: Goals include to maximize quality of life and symptom management. States son is POA. He is only child. He is in [place]. He takes her to the doctor because she's used up her [employer]. States she has a living will and it's in the possession of her son. Also has a  Niece who is visiting and helping out.   2. Symptom Management:   Ascites: Drawing off every 2 weeks. Does not have permanent catheter.   Mentation:Note from [facility] states MELD-NA of 23, and recommendation to have family member live with her.She appears to be a good historian in some respects but may have some forgetfulness.  ADLS: Needs to have help getting out of tub. Needs rails for bed for bed mobility. He has her own bed and these would be add on. States she can cook some. She was getting meals sent thru [facility]. She is on a sodium restriction.  Pain: Denies.   Exercise: Has home PT and uses walker. States home health pt is finishing this week.   3. Family /Caregiver/Community Supports: Son in [place] is POA. Friends and other family are looking in on her daily. Has some resources through [facility] she states.  4. Cognitive / Functional  decline:  Appears to have some forgetfulness but may be complicated by health literacy needs. Able to do some adls  And a  Few iadls but need help.  5. Follow up Palliative Care Visit: Palliative care will continue to follow for goals of care clarification and symptom management. Return 4-6 weeks or prn.  I spent 60 minutes providing this consultation,  from 1600 to 1700. More than 50% of the time in this consultation was spent coordinating communication.   HISTORY OF PRESENT ILLNESS:  Cheyenne Hicks is a 68 y.o. year old female with multiple medical problems including AKI, alcoholic cirrhosis of liver with ascites, anemia,esophageal varices, DM2, CAD, tobacco use disorder. Palliative Care was asked to follow this patient by consultation request of Zhou-Talbert, Elwyn Lade,* to help address advance care planning and goals of care. This is a follow up visit.  CODE STATUS: FULL  PPS: 50% HOSPICE ELIGIBILITY/DIAGNOSIS: TBd/CHF, cirrhosis  PAST MEDICAL HISTORY:  Past Medical History:  Diagnosis Date  . Ascites   . CHF (congestive heart failure) ([place])   . Cirrhosis ([facility] [place])   . Diabetes mellitus without complication ([place])   . Esophageal varices ([place])   . GERD (gastroesophageal reflux disease)   . GI bleed 04/2018   EGD with ulcer, esophagitis  . Hyperlipidemia   . Hypertension   . Liver cirrhosis secondary to NASH ([place])   . NSTEMI (non-ST elevated myocardial infarction) ([place])    11/2014  . Portal hypertension ([facility])     SOCIAL HX:  Social History   Tobacco Use  . Smoking status: Current  Some Day Smoker    Packs/day: 0.25    Types: Cigarettes  . Smokeless tobacco: Never Used  Substance Use Topics  . Alcohol use: Not Currently    Alcohol/week: 0.0 standard drinks    ALLERGIES:  Allergies  Allergen Reactions  . Iodinated Diagnostic Agents Hives    Reports severe hives and syncope after heart cath  . Metoprolol Itching     PERTINENT MEDICATIONS:  Outpatient Encounter Medications  as of 01/28/2019  Medication Sig  . acetaminophen (TYLENOL) 650 MG CR tablet Take 650 mg by mouth every 6 (six) hours as needed for pain.  . brimonidine (ALPHAGAN) 0.2 % ophthalmic solution Place 1 drop into both eyes 3 (three) times daily.  . ferrous gluconate (FERGON) 324 MG tablet Take 324 mg by mouth every morning.   . folic acid (FOLVITE) 1 MG tablet Take 1 mg by mouth daily.  Marland Kitchen gabapentin (NEURONTIN) 100 MG capsule Take 100 mg by mouth 3 (three) times daily.  Marland Kitchen latanoprost (XALATAN) 0.005 % ophthalmic solution Place 1 drop into both eyes at bedtime.  . metoCLOPramide (REGLAN) 5 MG tablet Take 5 mg by mouth 3 (three) times daily.  . Multiple Vitamin (MULTIVITAMIN WITH MINERALS) TABS tablet Take 1 tablet by mouth daily.  . pantoprazole (PROTONIX) 40 MG tablet Take 1 tablet (40 mg total) by mouth daily. (Patient taking differently: Take 40 mg by mouth 2 (two) times daily. )  . Vitamin D, Ergocalciferol, (DRISDOL) 1.25 MG (50000 UT) CAPS capsule Take 50,000 Units by mouth every Monday.  . bumetanide (BUMEX) 1 MG tablet Take 1 mg by mouth daily.  . furosemide (LASIX) 80 MG tablet Take 80 mg by mouth every morning.   . insulin detemir (LEVEMIR) 100 UNIT/ML injection Inject 14 Units into the skin every evening.   . linagliptin (TRADJENTA) 5 MG TABS tablet Take 5 mg by mouth daily.  . metFORMIN (GLUCOPHAGE) 500 MG tablet Take 500 mg by mouth 2 (two) times daily with a meal.   . simvastatin (ZOCOR) 40 MG tablet Take 40 mg by mouth daily at 6 PM.  . Skin Protectants, Misc. (EUCERIN) cream Apply 1 application topically daily as needed for dry skin.  Marland Kitchen spironolactone (ALDACTONE) 100 MG tablet Take 200 mg by mouth daily.  . sucralfate (CARAFATE) 1 GM/10ML suspension Take 1 g by mouth 4 (four) times daily -  with meals and at bedtime.  . traMADol (ULTRAM) 50 MG tablet Take 50 mg by mouth every 6 (six) hours as needed for moderate pain.   No facility-administered encounter medications on file as of  01/28/2019.     PHYSICAL EXAM / ROS:   Current and past weights: 164 lbs 10/20; 157 lb 11/20. General: NAD, frail appearing, obese Cardiovascular: no chest pain reported, 1-2+ edema, no longer on diuretics. Pulmonary: no cough, no increased SOB, smoker every day Abdomen: appetite fair, endorses constipation, continent of bowel, significant ascites GU: denies dysuria, continent of urine  MSK:  no joint deformities, ambulatory with device, denies falls Skin: no rashes or wounds reported Neurological: Weakness, sleeps well. Denies pain  Jason Coop, NP  [facility]  COVID-19 PATIENT SCREENING TOOL  Person answering questions: _______Glad[place]____________ _____   1.  Is the patient or any family member in the home showing any signs or symptoms regarding respiratory infection?               Person with Symptom- _____________na______________  a. Fever  Yes___ No___          ___________________  b. Shortness of breath                                                    Yes___ No___          ___________________ c. Cough/congestion                                       Yes___  No___         ___________________ d. Body aches/pains                                                         Yes___ No___        ____________________ e. Gastrointestinal symptoms (diarrhea, nausea)           Yes___ No___        ____________________  2. Within the past 14 days, has anyone living in the home had any contact with someone with or under investigation for COVID-19?    Yes___ No_x_   Person __________________

## 2019-02-01 ENCOUNTER — Telehealth: Payer: Self-pay | Admitting: Primary Care

## 2019-02-01 NOTE — Telephone Encounter (Signed)
Call from PCP to discuss plan of care .Prognosis is poor and patient has requested full code status. We will have family meeting in January to explore patient's goals and offer available resources for care. MD will contact family to set up virtual family meeting for 02/26/2019.

## 2019-02-04 ENCOUNTER — Telehealth: Payer: Self-pay

## 2019-02-26 ENCOUNTER — Telehealth: Payer: Self-pay

## 2019-02-26 ENCOUNTER — Telehealth: Payer: Self-pay | Admitting: Primary Care

## 2019-02-26 ENCOUNTER — Other Ambulatory Visit: Payer: Self-pay

## 2019-02-26 ENCOUNTER — Other Ambulatory Visit: Payer: Medicare Other | Admitting: Primary Care

## 2019-02-26 MED ORDER — MELATONIN 3 MG PO TABS
3.00 | ORAL_TABLET | ORAL | Status: DC
Start: ? — End: 2019-02-26

## 2019-02-26 MED ORDER — ONDANSETRON HCL 4 MG/2ML IJ SOLN
4.00 | INTRAMUSCULAR | Status: DC
Start: ? — End: 2019-02-26

## 2019-02-26 MED ORDER — GABAPENTIN 100 MG PO CAPS
100.00 | ORAL_CAPSULE | ORAL | Status: DC
Start: 2019-02-26 — End: 2019-02-26

## 2019-02-26 MED ORDER — ALBUTEROL SULFATE (2.5 MG/3ML) 0.083% IN NEBU
2.50 | INHALATION_SOLUTION | RESPIRATORY_TRACT | Status: DC
Start: ? — End: 2019-02-26

## 2019-02-26 MED ORDER — FERROUS SULFATE 325 (65 FE) MG PO TABS
325.00 | ORAL_TABLET | ORAL | Status: DC
Start: 2019-02-27 — End: 2019-02-26

## 2019-02-26 MED ORDER — GENERIC EXTERNAL MEDICATION
500.00 | Status: DC
Start: 2019-02-26 — End: 2019-02-26

## 2019-02-26 MED ORDER — ASPIRIN 81 MG PO CHEW
81.00 | CHEWABLE_TABLET | ORAL | Status: DC
Start: 2019-02-26 — End: 2019-02-26

## 2019-02-26 MED ORDER — ACETAMINOPHEN 325 MG PO TABS
650.00 | ORAL_TABLET | ORAL | Status: DC
Start: ? — End: 2019-02-26

## 2019-02-26 MED ORDER — METOCLOPRAMIDE HCL 10 MG PO TABS
5.00 | ORAL_TABLET | ORAL | Status: DC
Start: 2019-02-26 — End: 2019-02-26

## 2019-02-26 MED ORDER — PANTOPRAZOLE SODIUM 40 MG PO TBEC
40.00 | DELAYED_RELEASE_TABLET | ORAL | Status: DC
Start: 2019-02-26 — End: 2019-02-26

## 2019-02-26 MED ORDER — CALCIUM CARBONATE ANTACID 500 MG PO CHEW
CHEWABLE_TABLET | ORAL | Status: DC
Start: ? — End: 2019-02-26

## 2019-02-26 MED ORDER — FOLIC ACID 1 MG PO TABS
1.00 | ORAL_TABLET | ORAL | Status: DC
Start: 2019-02-26 — End: 2019-02-26

## 2019-02-26 MED ORDER — SODIUM BICARBONATE 650 MG PO TABS
650.00 | ORAL_TABLET | ORAL | Status: DC
Start: 2019-02-26 — End: 2019-02-26

## 2019-02-26 MED ORDER — GUAIFENESIN 100 MG/5ML PO SYRP
200.00 | ORAL_SOLUTION | ORAL | Status: DC
Start: ? — End: 2019-02-26

## 2019-02-26 NOTE — Telephone Encounter (Signed)
Left message to phone RE scheduling a visit with home palliative medicine.

## 2019-02-26 NOTE — Telephone Encounter (Signed)
Received a message that [place] from [facility] called to inform Palliative care that patient is in the hospital and to cancel scheduled visit. Katie NP updated

## 2019-02-28 ENCOUNTER — Telehealth: Payer: Self-pay | Admitting: Primary Care

## 2019-02-28 NOTE — Telephone Encounter (Signed)
Call from son RE disposition from hospital. Patient was admitted for peritoneal infection. We discussed her disease course, and her debility. We discussed SNF placement; hospital had wanted to send her for rehab but patient and family opted for coming back home. She has a home health PT currently, per son. I discussed goals of care in light of progressive and debilitating disease. They may consider hospice when appropriate. We discussed SNF stay later on, as patient does not have ATC caregiver. I asked son to have someone stay over this weekend until my visit on Monday. He will join me in the home on Monday for goals of care discussion.

## 2019-03-04 ENCOUNTER — Other Ambulatory Visit: Payer: Medicare Other | Admitting: Primary Care

## 2019-03-04 ENCOUNTER — Other Ambulatory Visit: Payer: Self-pay

## 2019-03-04 DIAGNOSIS — Z515 Encounter for palliative care: Secondary | ICD-10-CM

## 2019-03-04 NOTE — Progress Notes (Addendum)
[facility] Palliative Care Consult Note Telephone: [phone]  Fax: [phone]   PATIENT NAME: Cheyenne Hicks No address on file. [phone] (home)  DOB: 24-Feb-1950 MRN: [id number]  PRIMARY CARE PROVIDER:   Alfonse Flavors, MD, [address] [place] [address] [phone]  REFERRING PROVIDER:  Alfonse Flavors, MD [address] [place],  [place] [address] [phone]  RESPONSIBLE PARTY:   Extended Emergency Contact Information Primary Emergency Contact: Angie Fava States of [place] Phone: [phone] Relation: Sister Secondary Emergency Contact: [place] of [place] Phone: [phone] Relation: Son   ASSESSMENT AND RECOMMENDATIONS:   1. Advance Care Planning/Goals of Care: Goals include to maximize quality of life and symptom management. We had an advance care planning discussion. She states she had been asked at [facility] about resuscitation and she elected to have full code with full scope of care. She then stated she  didn't want to have to live on machines. Will discuss this further.  I met her son Lavell Anchors today and he was present for goals of care discussion. MOST form done and uploaded to [facility]. We have a video meeting scheduled to discuss ACP with PCP on next visit 1/21 at 430 pm.  2. Symptom Management:   She stated that there were medication changes which are reviewed with her today. She said she's currently off all diabetic medication's. Her son endorses that she has lost at least 100 pounds in the last year and a half with this illness.   Ambulation/ mobility: she feels this is decreasing she said she uses her walker always. She denies falls.   Nutrition: We discussed her intake she has several types of Gatorade which she thought would be helpful and said she would be going to the store for more juice and fruit. Her albumin is low (2.6) and I asked her if she had protein  sources for food. She had some anorexia so I encouraged her to try some Ensure or boost to increase her protein intake. Education provided about her nutritional needs.  Needs MOW, inquiry email sent. States she has just been treated for thrush with 2 doses of flagyl. She does not complain of oral pain.  Diabetes control: States she is no longer on insulin or glucose checks. She has lost > 100 lbs in past 1.5 years.    Mobility: Has home health PT coming   She states from [employer]. I called this company as I'm not familiar but could not reach them to ask about personal care CNA. Pt states using walker more. Denies falls.   Caregiving: Patient has medicaid. Would she qualify for  PCS from [facility]. Recommend PCP put in referral for patient to have PCS worker.  3. Family /Caregiver/Community Supports: I saw Mrs. Clagg today in her home with her son Lavell Anchors and a niece. She has been in the hospital, and was discharged Friday, for a peritoneal infection. The patient states she plans to continue to go to [facility] every two weeks for peritoneal tapping.  4. Cognitive / Functional decline: Alert and oriented x 3, losing stamina per her report. Needs help with most IADLS, and many ADLS.  5. Follow up Palliative Care Visit: Palliative care will continue to follow for goals of care clarification and symptom management. Return 2 weeks or prn.  I spent 60 minutes providing this consultation,  from 1200 to 1300 More than 50% of the time in this consultation was  spent coordinating communication.   HISTORY OF PRESENT ILLNESS:  Cheyenne Hicks is a 69 y.o. year old female with multiple medical problems including CHF, COPD, ascites, cirrhosis, DM, portal hypertension. Palliative Care was asked to follow this patient by consultation request of Zhou-Talbert, Elwyn Lade,* to help address advance care planning and goals of care. This is a follow up visit.  CODE STATUS: Full Code  PPS: 40% HOSPICE  ELIGIBILITY/DIAGNOSIS: TBD  PAST MEDICAL HISTORY:  Past Medical History:  Diagnosis Date  . Ascites   . CHF (congestive heart failure) ([place])   . Cirrhosis ([place])   . Diabetes mellitus without complication ([place])   . Esophageal varices ([place])   . GERD (gastroesophageal reflux disease)   . GI bleed 04/2018   EGD with ulcer, esophagitis  . Hyperlipidemia   . Hypertension   . Liver cirrhosis secondary to NASH ([place])   . NSTEMI (non-ST elevated myocardial infarction) ([place])    11/2014  . Portal hypertension ([facility])     SOCIAL HX:  Social History   Tobacco Use  . Smoking status: Current Some Day Smoker    Packs/day: 0.25    Types: Cigarettes  . Smokeless tobacco: Never Used  Substance Use Topics  . Alcohol use: Not Currently    Alcohol/week: 0.0 standard drinks    ALLERGIES:  Allergies  Allergen Reactions  . Iodinated Diagnostic Agents Hives    Reports severe hives and syncope after heart cath  . Metoprolol Itching     PERTINENT MEDICATIONS:  Outpatient Encounter Medications as of 03/04/2019  Medication Sig  . acetaminophen (TYLENOL) 650 MG CR tablet Take 650 mg by mouth every 6 (six) hours as needed for pain.  . brimonidine (ALPHAGAN) 0.2 % ophthalmic solution Place 1 drop into both eyes 3 (three) times daily.  . bumetanide (BUMEX) 1 MG tablet Take 1 mg by mouth daily.  . ferrous gluconate (FERGON) 324 MG tablet Take 324 mg by mouth every morning.   . folic acid (FOLVITE) 1 MG tablet Take 1 mg by mouth daily.  . furosemide (LASIX) 80 MG tablet Take 80 mg by mouth every morning.   . gabapentin (NEURONTIN) 100 MG capsule Take 100 mg by mouth 3 (three) times daily.  . insulin detemir (LEVEMIR) 100 UNIT/ML injection Inject 14 Units into the skin every evening.   . latanoprost (XALATAN) 0.005 % ophthalmic solution Place 1 drop into both eyes at bedtime.  Marland Kitchen linagliptin (TRADJENTA) 5 MG TABS tablet Take 5 mg by mouth daily.  . metFORMIN (GLUCOPHAGE) 500 MG tablet Take 500 mg by mouth  2 (two) times daily with a meal.   . metoCLOPramide (REGLAN) 5 MG tablet Take 5 mg by mouth 3 (three) times daily.  . Multiple Vitamin (MULTIVITAMIN WITH MINERALS) TABS tablet Take 1 tablet by mouth daily.  . pantoprazole (PROTONIX) 40 MG tablet Take 1 tablet (40 mg total) by mouth daily. (Patient taking differently: Take 40 mg by mouth 2 (two) times daily. )  . simvastatin (ZOCOR) 40 MG tablet Take 40 mg by mouth daily at 6 PM.  . Skin Protectants, Misc. (EUCERIN) cream Apply 1 application topically daily as needed for dry skin.  Marland Kitchen spironolactone (ALDACTONE) 100 MG tablet Take 200 mg by mouth daily.  . sucralfate (CARAFATE) 1 GM/10ML suspension Take 1 g by mouth 4 (four) times daily -  with meals and at bedtime.  . traMADol (ULTRAM) 50 MG tablet Take 50 mg by mouth every 6 (six) hours as needed for moderate pain.  Marland Kitchen  Vitamin D, Ergocalciferol, (DRISDOL) 1.25 MG (50000 UT) CAPS capsule Take 50,000 Units by mouth every Monday.   No facility-administered encounter medications on file as of 03/04/2019.    PHYSICAL EXAM / ROS: 106/68 HR 95, R 20 Current and past weights: Unknown recent weight General: NAD, frail appearing, thinner than baeline, large ascites Cardiovascular: no chest pain reported, 1-2+ LE edema,  Pulmonary: no cough, no increased SOB, lungs clear Abdomen: appetite poor, denies constipation, continent of bowel, significant ascites GU: denies dysuria, continent of urine MSK:  no joint deformities, ambulatory with walker, denies falls Skin: no rashes or wounds reported Neurological: Weakness, denies pain  Jason Coop, NP  [facility]  COVID-19 PATIENT SCREENING TOOL  Person answering questions: _________________Gladys__ _____   1.  Is the patient or any family member in the home showing any signs or symptoms regarding respiratory infection?               Person with Symptom- _______________na____________  a. Fever                                                                           Yes___ No___          ___________________  b. Shortness of breath                                                    Yes___ No___          ___________________ c. Cough/congestion                                       Yes___  No___         ___________________ d. Body aches/pains                                                         Yes___ No___        ____________________ e. Gastrointestinal symptoms (diarrhea, nausea)           Yes___ No___        ____________________  2. Within the past 14 days, has anyone living in the home had any contact with someone with or under investigation for COVID-19?    Yes___ No__x   Person __________________

## 2019-03-05 ENCOUNTER — Telehealth: Payer: Self-pay | Admitting: Primary Care

## 2019-03-05 NOTE — Telephone Encounter (Signed)
T/c to [facility] at Cheyenne Hicks's permission to make referral. Information was taken. There is a waiting list which should open up in a few weeks. Referral made and MOW will F/u with patient.

## 2019-03-07 ENCOUNTER — Telehealth: Payer: Self-pay | Admitting: Primary Care

## 2019-03-07 NOTE — Telephone Encounter (Signed)
T/c to [place] Medicaid to ascertain if patient has benefits. She is in need of personal care services.

## 2019-03-11 ENCOUNTER — Other Ambulatory Visit: Payer: Medicare HMO | Admitting: Primary Care

## 2019-03-12 NOTE — Telephone Encounter (Signed)
Error

## 2019-03-14 ENCOUNTER — Other Ambulatory Visit: Payer: Medicare Other | Admitting: Primary Care

## 2019-03-14 ENCOUNTER — Other Ambulatory Visit: Payer: Self-pay

## 2019-03-14 DIAGNOSIS — Z515 Encounter for palliative care: Secondary | ICD-10-CM

## 2019-03-14 NOTE — Progress Notes (Signed)
[facility] Consult Note Telephone: [phone]  Fax: [phone]   PATIENT NAME: Cheyenne Hicks [address] [place] [address] [phone] (home)  DOB: 12/05/1950 MRN: [id number]  PRIMARY CARE PROVIDER:   Alfonse Flavors, MD, [address] [place] [address] [phone]  REFERRING PROVIDER:  Alfonse Flavors, MD [address] [place],  [place] [address] [phone]  RESPONSIBLE PARTY:   Extended Emergency Contact Information Primary Emergency Contact: Cheyenne Hicks States of [place] Phone: [phone] Relation: Sister Secondary Emergency Contact: Cheyenne Hicks Phone: [phone] Relation: Son   ASSESSMENT AND RECOMMENDATIONS:   1. Advance Care Planning/Goals of Care: Goals include to maximize quality of life and symptom management. I met with patient and her son Cheyenne Hicks and her primary provider Cheyenne Hicks by zoom today by prearranged appointment to discuss advance care planning. We discussed her limited liver function and that her disease cannot be cured. Currently she has a sizable and very uncomfortable ascites. We discussed hospice is being a resource whereby she can have nurses and aides as well as the other agency services and that the services can be in her own home. Initially she was afraid she had to go to a facility and rightly felt she was not at this point.  2. Symptom Management:   Ascites: I suggested an indwelling Paracentesis catheter which could then be drained by a hospice nurse or family members with education. I feel this would very much improved [name] comfort and quality of life. Currently she has been going to have it drained on an outpatient basis every two weeks but her ascites is significant by the time of the drain. Draining every other day or so would improve her ascites control immensely. She wants some time to  consider hospice services and her son and she will discuss tomorrow when they go to [facility] for the paracentesis. She stated she understands there's nothing more they can do to cure her disease. She said that her goals  would include being comfortable and as independent in her home as possible. I will follow up after she and her G.I. team have had time to consider. PCP will also reach out to G.I. team.  Symptom management: She also has some pain associated with immobility which could be addressed readily through hospice admission.   Safety: Some concern for her safely when she is alone at the apartment at night. She has some pull cords in the apartment but they are fixed. I've  placed a call to Cheyenne Hicks at [facility] to find out if she would be able to get a call bell device for home safety.  3. Family /Caregiver/Community Supports: Lives alone in subsidized apartment. Has friends and family checking in but is alone at night.  4. Cognitive / Functional decline: Alert and oriented, able to make her decisions. She is losing function with difficulty in ambulation and self care, needing help with most adls and iadls.   5. Follow up Palliative Care Visit: Palliative care will continue to follow for goals of care clarification and symptom management. Return 1-2 weeks or prn.  I spent 60 minutes providing this consultation,  from 1630 to 1730. More than 50% of the time in this consultation was spent coordinating communication.   HISTORY OF PRESENT ILLNESS:  Cheyenne Hicks is a 69 y.o. year old female with multiple medical problems including CHF, COPD, ascites, cirrhosis, DM,  portal hypertension. Palliative Care was asked to follow this patient by consultation request of Cheyenne Hicks, Cheyenne Hicks,* to help address advance care planning and goals of care. This is a follow up visit.  CODE STATUS: FULL code, considering MOST form  PPS: 30% HOSPICE ELIGIBILITY/DIAGNOSIS: yes/end stage liver disease.  Abnormal weight loss.  PAST MEDICAL HISTORY:  Past Medical History:  Diagnosis Date  . Ascites   . CHF (congestive heart failure) ([place])   . Cirrhosis ([place])   . Diabetes mellitus without complication ([place])   . Esophageal varices ([place])   . GERD (gastroesophageal reflux disease)   . GI bleed 04/2018   EGD with ulcer, esophagitis  . Hyperlipidemia   . Hypertension   . Liver cirrhosis secondary to NASH ([place])   . NSTEMI (non-ST elevated myocardial infarction) ([place])    11/2014  . Portal hypertension ([facility])     SOCIAL HX:  Social History   Tobacco Use  . Smoking status: Current Some Day Smoker    Packs/day: 0.25    Types: Cigarettes  . Smokeless tobacco: Never Used  Substance Use Topics  . Alcohol use: Not Currently    Alcohol/week: 0.0 standard drinks    ALLERGIES:  Allergies  Allergen Reactions  . Iodinated Diagnostic Agents Hives    Reports severe hives and syncope after heart cath  . Metoprolol Itching     PERTINENT MEDICATIONS:  Outpatient Encounter Medications as of 03/14/2019  Medication Sig  . acetaminophen (TYLENOL) 650 MG CR tablet Take 650 mg by mouth every 6 (six) hours as needed for pain.  . brimonidine (ALPHAGAN) 0.2 % ophthalmic solution Place 1 drop into both eyes 3 (three) times daily.  . ferrous sulfate 325 (65 FE) MG tablet Take 325 mg by mouth daily with breakfast.  . folic acid (FOLVITE) 1 MG tablet Take 1 mg by mouth daily.  Marland Kitchen gabapentin (NEURONTIN) 100 MG capsule Take 100 mg by mouth 3 (three) times daily.  Marland Kitchen latanoprost (XALATAN) 0.005 % ophthalmic solution Place 1 drop into both eyes at bedtime.  . metoCLOPramide (REGLAN) 5 MG tablet Take 5 mg by mouth 3 (three) times daily.  . Multiple Vitamin (MULTIVITAMIN WITH MINERALS) TABS tablet Take 1 tablet by mouth daily.  . pantoprazole (PROTONIX) 40 MG tablet Take 1 tablet (40 mg total) by mouth daily. (Patient taking differently: Take 40 mg by mouth 2 (two) times daily. )  . Skin Protectants, Misc.  (EUCERIN) cream Apply 1 application topically daily as needed for dry skin.  Marland Kitchen spironolactone (ALDACTONE) 100 MG tablet Take 200 mg by mouth daily.  . sucralfate (CARAFATE) 1 GM/10ML suspension Take 1 g by mouth 4 (four) times daily -  with meals and at bedtime.   No facility-administered encounter medications on file as of 03/14/2019.    PHYSICAL EXAM / ROS:   Current and past weights: 158 lbs dated 02/21/19. 197 lbs 12/19, her baseline. Currently has 20 lbs or more of ascites  General: NAD, frail appearing Cardiovascular: no chest pain reported, no edema in LE Pulmonary: no cough, no increased SOB Abdomen: appetite fair to poor, taking supplements,continent of bowel, major ascites GU: denies dysuria, continent of urine MSK:  no joint deformities, ambulatory in  Home with stand by assistance and walker Skin: no rashes or wounds reported Neurological: Weakness, endorse dull pain. A and O x 3.  Cyndia Skeeters NP,  [facility]  COVID-19 PATIENT SCREENING TOOL  Person answering questions: _______________Gladys____ _____   1.  Is the patient or any  family member in the home showing any signs or symptoms regarding respiratory infection?               Person with Symptom- ___________________________  a. Fever                                                                          Yes___ No___          ___________________  b. Shortness of breath                                                    Yes___ No___          ___________________ c. Cough/congestion                                       Yes___  No___         ___________________ d. Body aches/pains                                                         Yes___ No___        ____________________ e. Gastrointestinal symptoms (diarrhea, nausea)           Yes___ No___        ____________________  2. Within the past 14 days, has anyone living in the home had any contact with someone with or under investigation for COVID-19?    Yes___ No_x_    Person __________________

## 2019-04-04 ENCOUNTER — Telehealth: Payer: Self-pay | Admitting: Primary Care

## 2019-04-04 NOTE — Telephone Encounter (Signed)
Talked with  Cheyenne Hock NP  about Cheyenne Hicks and her current liver disease status. I let her know we had spoken a month ago about the plan of a tunneled catheter and Hospice. Cheyenne Hicks was open to this at that time. Meanwhile her PCP discussed this catheter plan with one of her physician caregivers at [facility] and it was deemed not appropriate at that time. Cheyenne Hicks concurs however, now, that this would be a good Symptom management intervention and if the patient would want to obtain an indwelling catheter for a home paracentesis that she would support this. I will discuss further with the patient this coming Monday on our home visit and Cheyenne. Charise Hicks will also speak with her at their upcoming clinic visit a week from tomorrow.

## 2019-04-04 NOTE — Telephone Encounter (Signed)
T/c to check on patient in light of low oxygen saturation at PCP office this week. They are trying to get home oxygen currently and waiting on insurance approval. Patient states she feels ok today and is not air hungry. I also discussed her status with [facility] Dr. Olam Idler. He states that tunneled catheter placement would be consistent with hospice goals if that is what patient chooses. This procedure holds risk of increased chance of infection. I will explore GOC on Monday with home visit with patient.

## 2019-04-08 ENCOUNTER — Other Ambulatory Visit: Payer: Self-pay

## 2019-04-08 ENCOUNTER — Other Ambulatory Visit: Payer: Medicare Other | Admitting: Primary Care

## 2019-04-08 MED ORDER — THERA-M PO TABS
1.00 | ORAL_TABLET | ORAL | Status: DC
Start: 2019-04-11 — End: 2019-04-08

## 2019-04-08 MED ORDER — PANTOPRAZOLE SODIUM 40 MG PO TBEC
40.00 | DELAYED_RELEASE_TABLET | ORAL | Status: DC
Start: 2019-04-10 — End: 2019-04-08

## 2019-04-08 MED ORDER — LACTULOSE 10 GM/15ML PO SOLN
30.00 | ORAL | Status: DC
Start: 2019-04-08 — End: 2019-04-08

## 2019-04-08 MED ORDER — METOCLOPRAMIDE HCL 10 MG PO TABS
5.00 | ORAL_TABLET | ORAL | Status: DC
Start: 2019-04-10 — End: 2019-04-08

## 2019-04-08 MED ORDER — INFLUENZA VAC SPLIT QUAD 0.5 ML IM SUSY
0.50 | PREFILLED_SYRINGE | INTRAMUSCULAR | Status: DC
Start: ? — End: 2019-04-08

## 2019-04-08 MED ORDER — FOLIC ACID 1 MG PO TABS
1.00 | ORAL_TABLET | ORAL | Status: DC
Start: 2019-04-11 — End: 2019-04-08

## 2019-04-08 MED ORDER — SODIUM BICARBONATE 650 MG PO TABS
1300.00 | ORAL_TABLET | ORAL | Status: DC
Start: 2019-04-08 — End: 2019-04-08

## 2019-04-08 MED ORDER — GENERIC EXTERNAL MEDICATION
1.00 | Status: DC
Start: 2019-04-08 — End: 2019-04-08

## 2019-04-08 MED ORDER — RIFAXIMIN 550 MG PO TABS
550.00 | ORAL_TABLET | ORAL | Status: DC
Start: 2019-04-10 — End: 2019-04-08

## 2019-04-10 MED ORDER — LACTULOSE 10 GM/15ML PO SOLN
30.00 | ORAL | Status: DC
Start: 2019-04-10 — End: 2019-04-10

## 2019-05-23 DEATH — deceased

## 2019-06-28 IMAGING — DX DG ABDOMEN 2V
4 series · 4 of 4 positions shown · non-contrast
Comparison: Ultrasound 09/12/2017.

CLINICAL DATA: Abdominal pain. Abdominal swelling and constipation.

EXAM:
ABDOMEN - 2 VIEW

[abdomen erect]
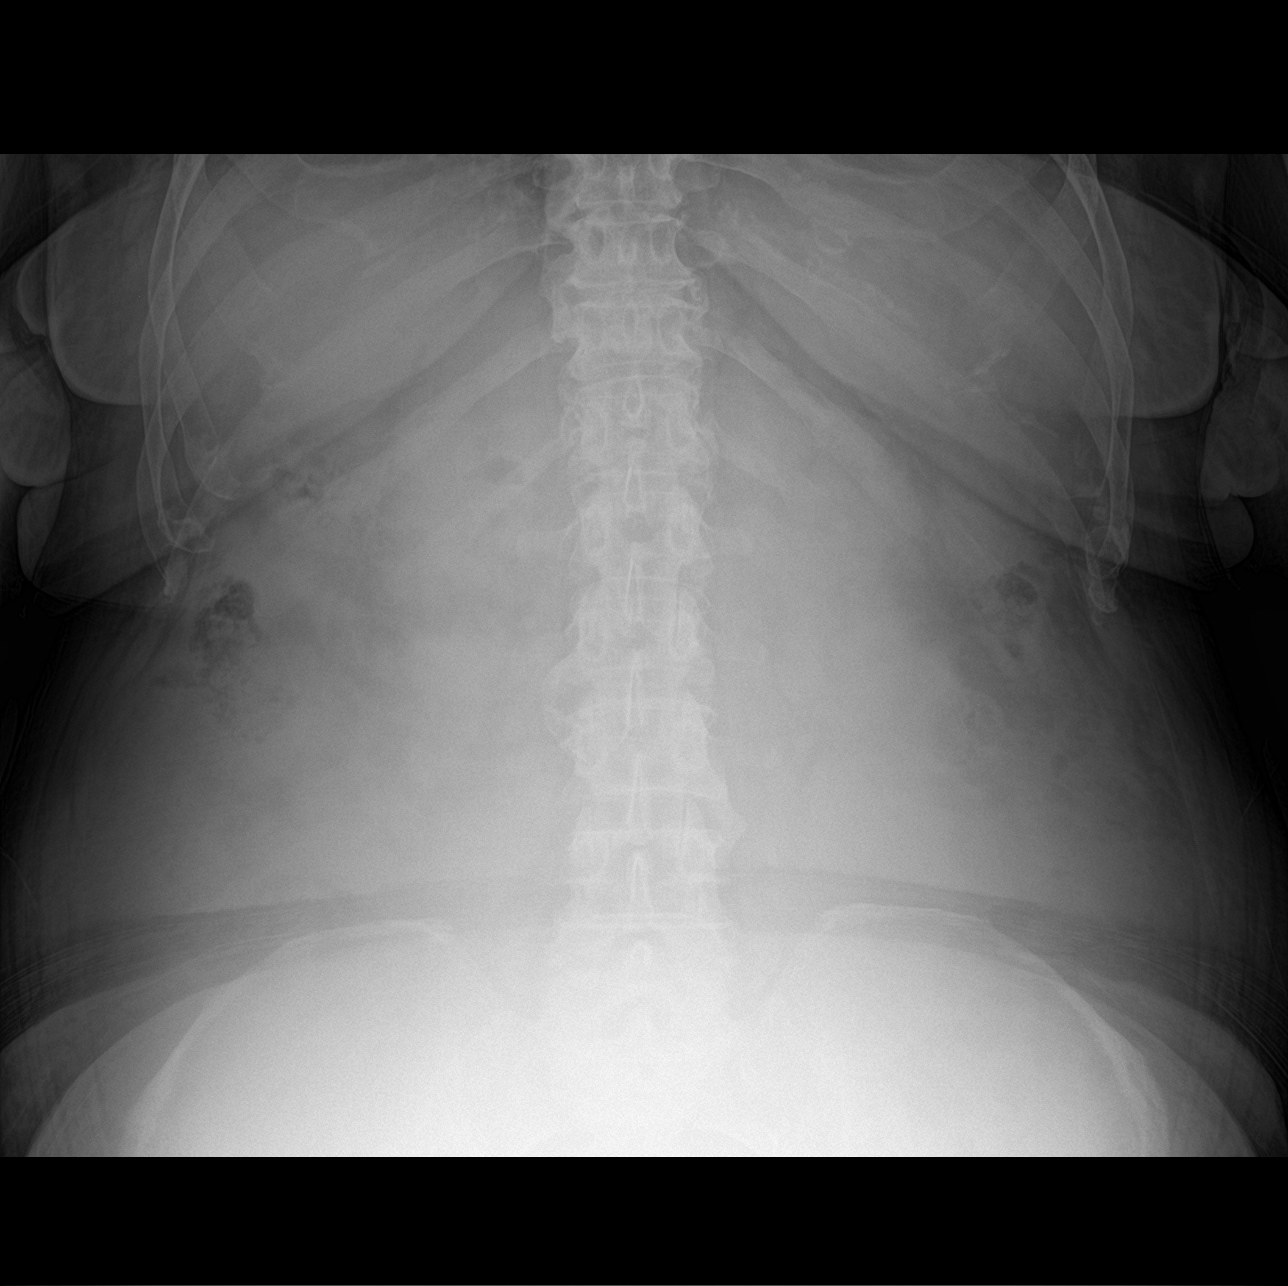

[abdomen supine (1 of 3)]
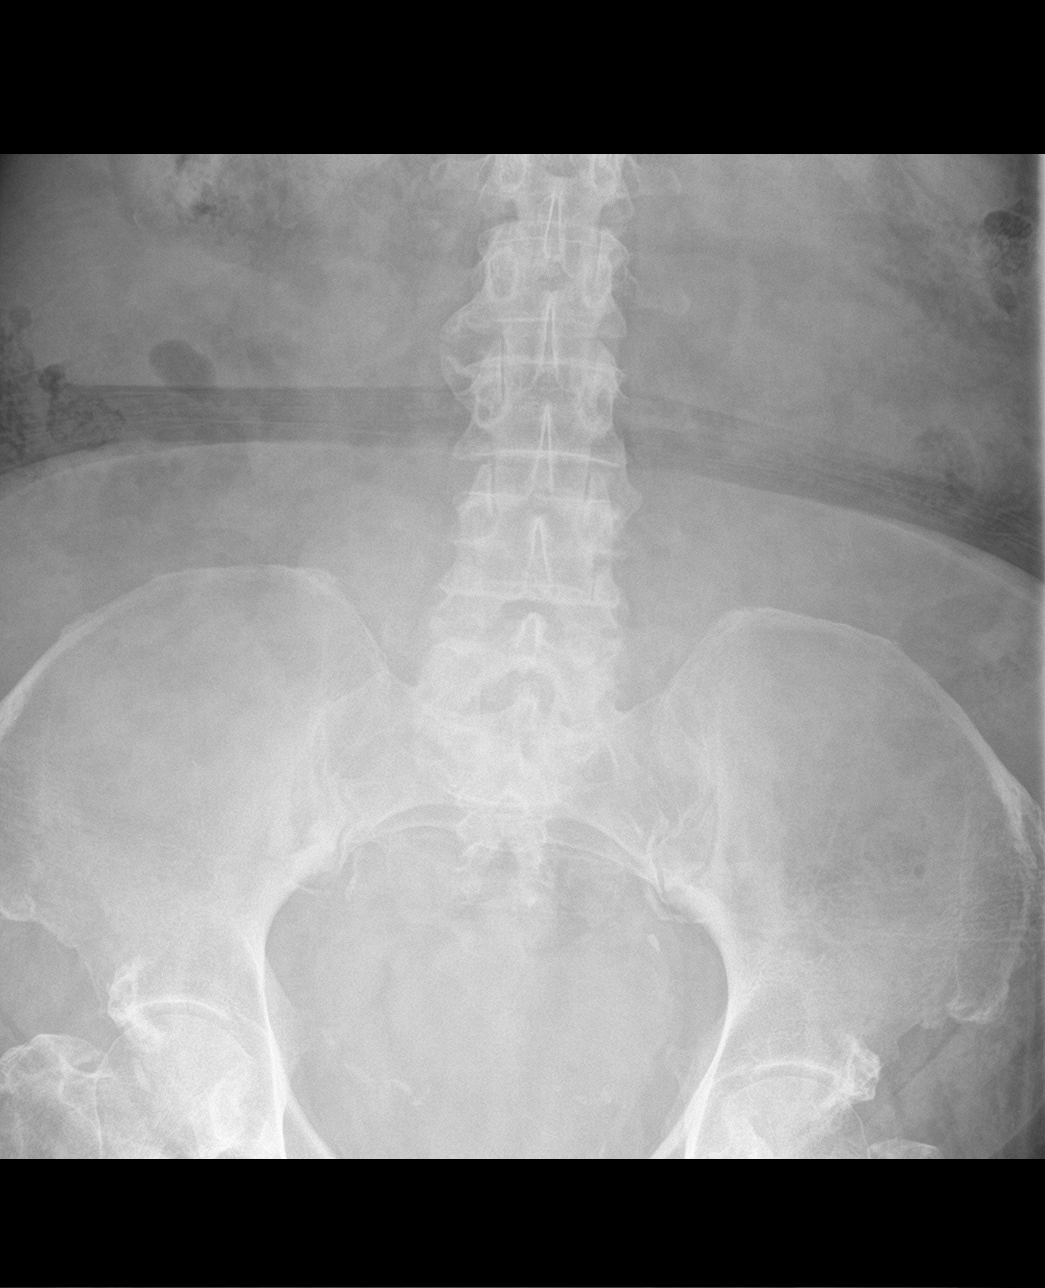

[abdomen supine (2 of 3)]
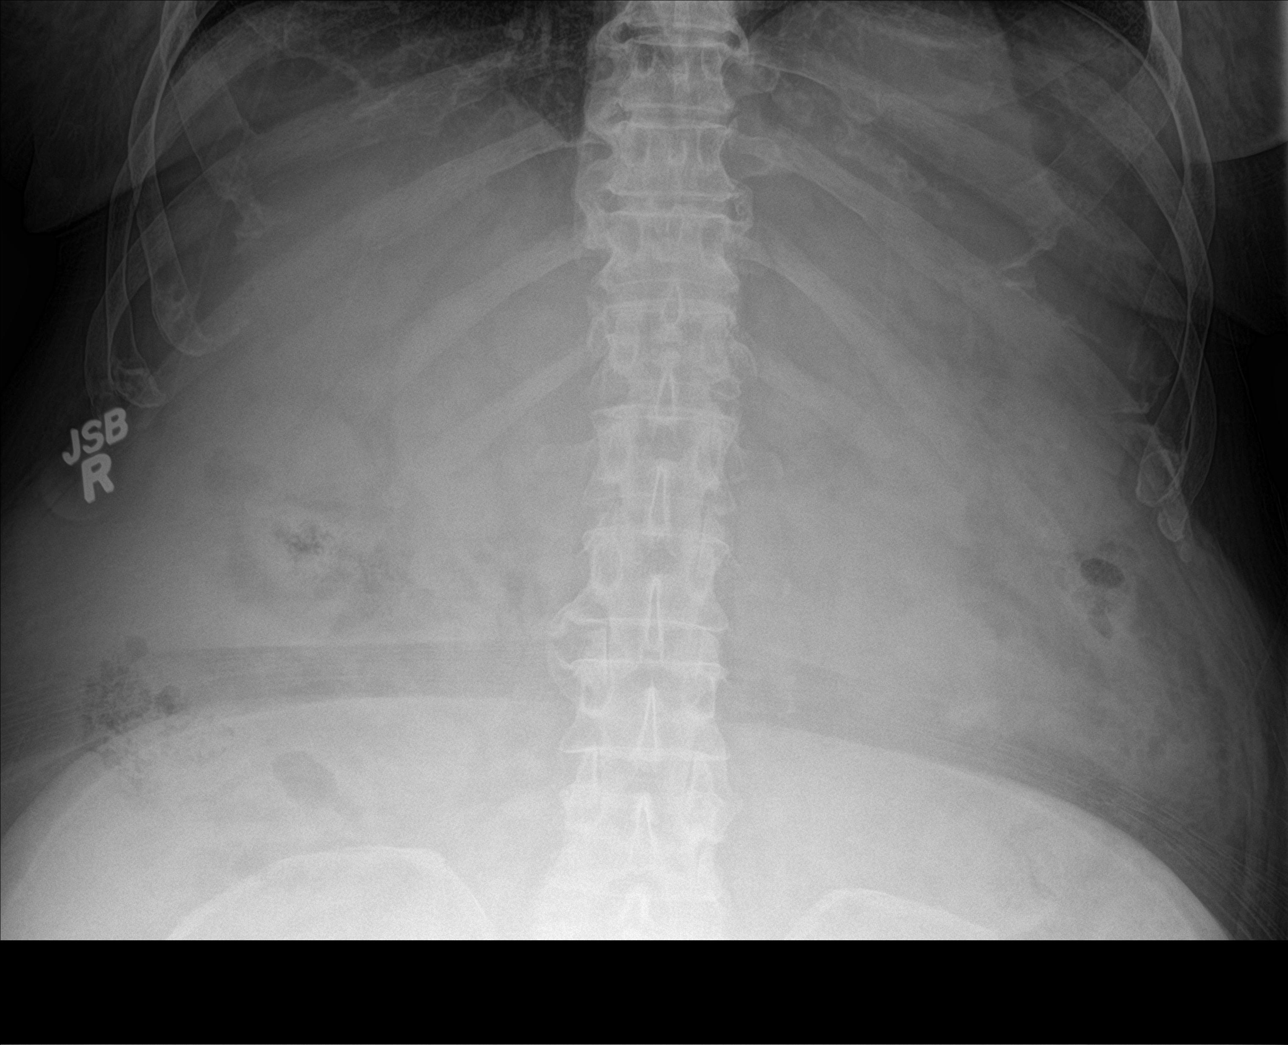

[abdomen supine (3 of 3)]
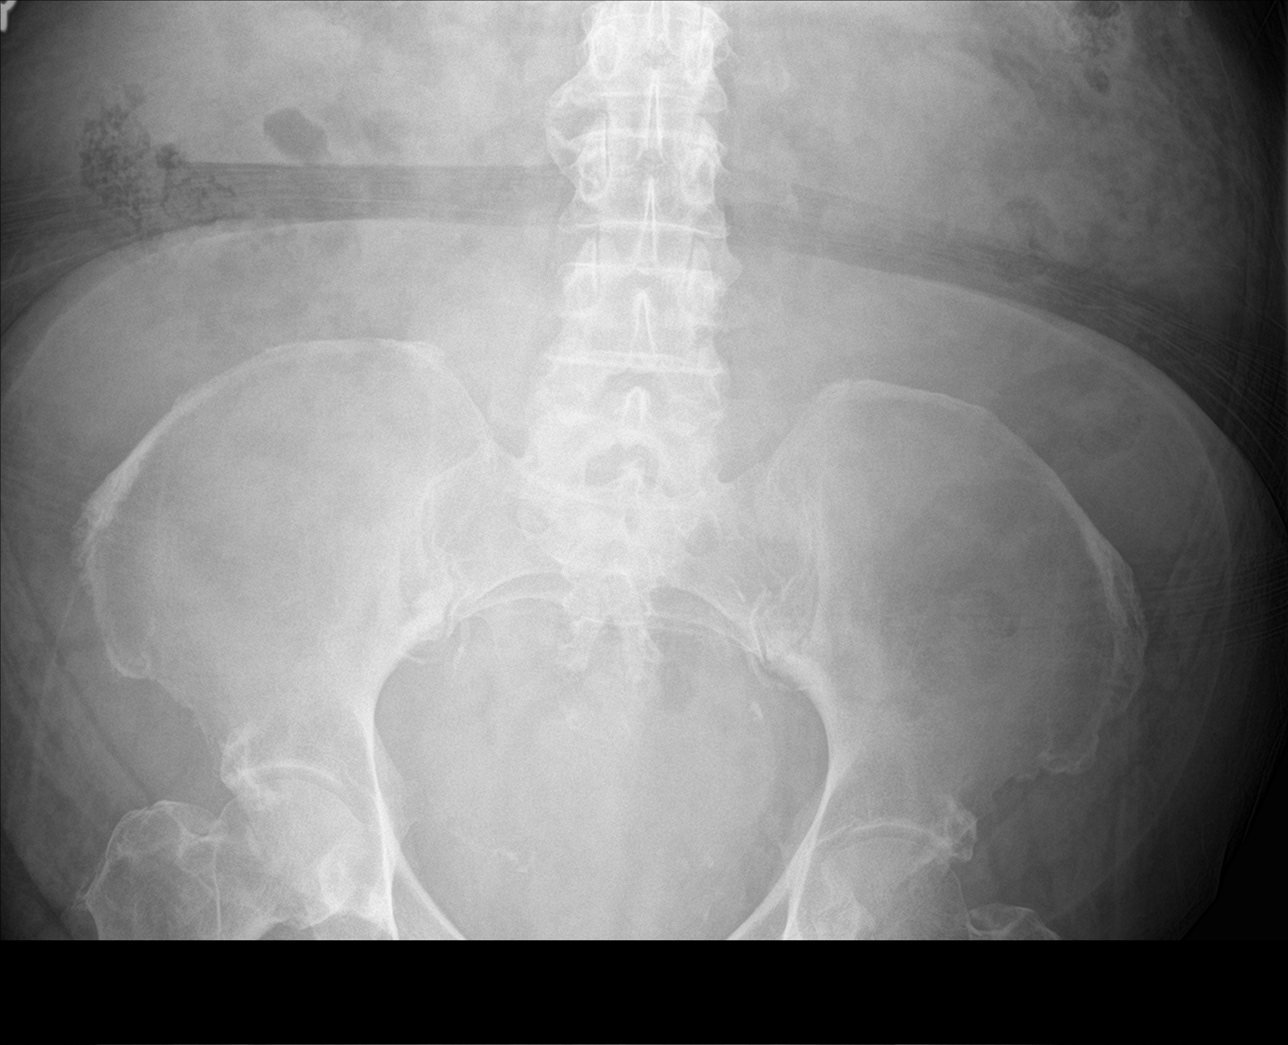

[4 of 4 positions shown; findings below may reference images not displayed]

FINDINGS: Soft tissue structures are unremarkable. Stool noted throughout the
colon. No free air. Degenerative changes lumbar spine and both hips.
Aortoiliac atherosclerotic vascular calcification.
IMPRESSION: 1. Stool noted throughout the colon. No bowel distention or free
air. No acute abnormality identified.

2.  Aortoiliac atherosclerotic vascular disease.
